# Patient Record
Sex: Female | Born: 1955 | Race: White | Hispanic: No | Marital: Single | Smoking: Never smoker
Health system: Southern US, Community
[De-identification: ages and names within clinical notes are randomized; demographics above are authoritative.]

## PROBLEM LIST (undated history)

## (undated) DIAGNOSIS — F79 Unspecified intellectual disabilities: Secondary | ICD-10-CM

## (undated) DIAGNOSIS — M629 Disorder of muscle, unspecified: Secondary | ICD-10-CM

## (undated) DIAGNOSIS — G809 Cerebral palsy, unspecified: Secondary | ICD-10-CM

## (undated) DIAGNOSIS — G839 Paralytic syndrome, unspecified: Secondary | ICD-10-CM

## (undated) DIAGNOSIS — R569 Unspecified convulsions: Secondary | ICD-10-CM

## (undated) DIAGNOSIS — G0490 Encephalitis and encephalomyelitis, unspecified: Secondary | ICD-10-CM

---

## 2003-06-04 ENCOUNTER — Encounter: Payer: Self-pay | Admitting: Family Medicine

## 2003-06-04 ENCOUNTER — Encounter: Admission: RE | Admit: 2003-06-04 | Discharge: 2003-06-04 | Payer: Self-pay | Admitting: Family Medicine

## 2004-06-04 ENCOUNTER — Encounter: Admission: RE | Admit: 2004-06-04 | Discharge: 2004-06-04 | Payer: Self-pay | Admitting: Family Medicine

## 2004-10-06 ENCOUNTER — Emergency Department (HOSPITAL_COMMUNITY): Admission: EM | Admit: 2004-10-06 | Discharge: 2004-10-06 | Payer: Self-pay | Admitting: Emergency Medicine

## 2004-11-11 ENCOUNTER — Ambulatory Visit (HOSPITAL_COMMUNITY): Admission: RE | Admit: 2004-11-11 | Discharge: 2004-11-11 | Payer: Self-pay | Admitting: Gastroenterology

## 2005-05-10 ENCOUNTER — Encounter: Admission: RE | Admit: 2005-05-10 | Discharge: 2005-05-10 | Payer: Self-pay | Admitting: Family Medicine

## 2005-06-07 ENCOUNTER — Encounter: Admission: RE | Admit: 2005-06-07 | Discharge: 2005-06-07 | Payer: Self-pay | Admitting: Family Medicine

## 2006-06-09 ENCOUNTER — Encounter: Admission: RE | Admit: 2006-06-09 | Discharge: 2006-06-09 | Payer: Self-pay | Admitting: Family Medicine

## 2006-06-30 ENCOUNTER — Encounter: Admission: RE | Admit: 2006-06-30 | Discharge: 2006-06-30 | Payer: Self-pay | Admitting: Family Medicine

## 2007-01-12 ENCOUNTER — Ambulatory Visit (HOSPITAL_COMMUNITY): Admission: RE | Admit: 2007-01-12 | Discharge: 2007-01-12 | Payer: Self-pay | Admitting: Family Medicine

## 2007-05-29 ENCOUNTER — Emergency Department (HOSPITAL_COMMUNITY): Admission: EM | Admit: 2007-05-29 | Discharge: 2007-05-29 | Payer: Self-pay | Admitting: Family Medicine

## 2007-07-04 ENCOUNTER — Encounter: Admission: RE | Admit: 2007-07-04 | Discharge: 2007-07-04 | Payer: Self-pay | Admitting: Family Medicine

## 2007-10-09 ENCOUNTER — Encounter: Admission: RE | Admit: 2007-10-09 | Discharge: 2007-10-09 | Payer: Self-pay | Admitting: Family Medicine

## 2008-07-09 ENCOUNTER — Encounter: Admission: RE | Admit: 2008-07-09 | Discharge: 2008-07-09 | Payer: Self-pay | Admitting: Family Medicine

## 2008-10-22 ENCOUNTER — Encounter: Admission: RE | Admit: 2008-10-22 | Discharge: 2008-10-22 | Payer: Self-pay | Admitting: Family Medicine

## 2008-10-22 ENCOUNTER — Inpatient Hospital Stay (HOSPITAL_COMMUNITY): Admission: EM | Admit: 2008-10-22 | Discharge: 2008-10-24 | Payer: Self-pay | Admitting: Emergency Medicine

## 2009-07-19 ENCOUNTER — Emergency Department (HOSPITAL_COMMUNITY): Admission: EM | Admit: 2009-07-19 | Discharge: 2009-07-19 | Payer: Self-pay | Admitting: Emergency Medicine

## 2010-09-05 ENCOUNTER — Encounter: Payer: Self-pay | Admitting: Family Medicine

## 2010-11-26 LAB — DIFFERENTIAL
Eosinophils Relative: 3 % (ref 0–5)
Lymphocytes Relative: 39 % (ref 12–46)
Lymphs Abs: 1.9 10*3/uL (ref 0.7–4.0)

## 2010-11-26 LAB — CARDIAC PANEL(CRET KIN+CKTOT+MB+TROPI)
CK, MB: 1.5 ng/mL (ref 0.3–4.0)
CK, MB: 3.4 ng/mL (ref 0.3–4.0)
Total CK: 77 U/L (ref 7–177)
Troponin I: <0.01 ng/mL (ref 0.00–0.06)

## 2010-11-26 LAB — BASIC METABOLIC PANEL
BUN: 14 mg/dL (ref 6–23)
BUN: 25 mg/dL — ABNORMAL HIGH (ref 6–23)
BUN: 28 mg/dL — ABNORMAL HIGH (ref 6–23)
BUN: 6 mg/dL (ref 6–23)
CO2: 25 mEq/L (ref 19–32)
CO2: 27 mEq/L (ref 19–32)
CO2: 27 mEq/L (ref 19–32)
Chloride: 101 mEq/L (ref 96–112)
Chloride: 115 mEq/L — ABNORMAL HIGH (ref 96–112)
Chloride: 122 mEq/L — ABNORMAL HIGH (ref 96–112)
Chloride: 128 mEq/L — ABNORMAL HIGH (ref 96–112)
Chloride: 129 mEq/L — ABNORMAL HIGH (ref 96–112)
Creatinine, Ser: 0.66 mg/dL (ref 0.4–1.2)
Creatinine, Ser: 0.67 mg/dL (ref 0.4–1.2)
GFR calc Af Amer: >60 mL/min (ref >60)
GFR calc Af Amer: >60 mL/min (ref >60)
GFR calc non Af Amer: >60 mL/min (ref >60)
Glucose, Bld: 110 mg/dL — ABNORMAL HIGH (ref 70–99)
Glucose, Bld: 216 mg/dL — ABNORMAL HIGH (ref 70–99)
Potassium: 2.9 mEq/L — ABNORMAL LOW (ref 3.5–5.1)
Potassium: 3 mEq/L — ABNORMAL LOW (ref 3.5–5.1)
Potassium: 3.4 mEq/L — ABNORMAL LOW (ref 3.5–5.1)
Potassium: 3.9 mEq/L (ref 3.5–5.1)
Sodium: 145 mEq/L (ref 135–145)
Sodium: 150 mEq/L — ABNORMAL HIGH (ref 135–145)
Sodium: 157 mEq/L — ABNORMAL HIGH (ref 135–145)

## 2010-11-26 LAB — CBC
HCT: 39.6 % (ref 36.0–46.0)
Platelets: 119 10*3/uL — ABNORMAL LOW (ref 150–400)
WBC: 4.8 10*3/uL (ref 4.0–10.5)

## 2010-11-26 LAB — URINE MICROSCOPIC-ADD ON

## 2010-11-26 LAB — URINALYSIS, ROUTINE W REFLEX MICROSCOPIC
Nitrite: NEGATIVE
Specific Gravity, Urine: 1.038 — ABNORMAL HIGH (ref 1.005–1.030)
Urobilinogen, UA: 1 mg/dL (ref 0.0–1.0)

## 2010-11-26 LAB — POCT I-STAT, CHEM 8
BUN: 40 mg/dL — ABNORMAL HIGH (ref 6–23)
Chloride: 126 mEq/L — ABNORMAL HIGH (ref 96–112)
Creatinine, Ser: 0.8 mg/dL (ref 0.4–1.2)
Potassium: 2.9 mEq/L — ABNORMAL LOW (ref 3.5–5.1)
Sodium: 163 mEq/L (ref 135–145)

## 2010-11-26 LAB — PHOSPHORUS
Phosphorus: 2.3 mg/dL (ref 2.3–4.6)
Phosphorus: 3.2 mg/dL (ref 2.3–4.6)

## 2010-11-26 LAB — HEMOGLOBIN A1C
Hgb A1c MFr Bld: 5.6 % (ref 4.6–6.1)
Mean Plasma Glucose: 114 mg/dL

## 2010-11-26 LAB — MAGNESIUM
Magnesium: 1.7 mg/dL (ref 1.5–2.5)
Magnesium: 2.1 mg/dL (ref 1.5–2.5)

## 2010-11-26 LAB — CK TOTAL AND CKMB (NOT AT ARMC)
CK, MB: 1.4 ng/mL (ref 0.3–4.0)
Total CK: 70 U/L (ref 7–177)

## 2010-11-26 LAB — URINE CULTURE

## 2010-11-26 LAB — HEPATIC FUNCTION PANEL
AST: 23 U/L (ref 0–37)
Albumin: 3.2 g/dL — ABNORMAL LOW (ref 3.5–5.2)

## 2010-11-26 LAB — HOMOCYSTEINE: Homocysteine: 7.9 umol/L (ref 4.0–15.4)

## 2010-11-26 LAB — TROPONIN I: Troponin I: <0.01 ng/mL (ref 0.00–0.06)

## 2010-12-29 NOTE — Discharge Summary (Signed)
[name], CURL                ACCOUNT NO.:  [id number]   MEDICAL RECORD NO.:  [id number]          PATIENT TYPE:  INP   LOCATION:  1232                         FACILITY:  [facility]   PHYSICIAN:  Herbie Saxon, MDDATE OF BIRTH:  02-15-1956   DATE OF ADMISSION:  10/22/2008  DATE OF DISCHARGE:  10/24/2008                               DISCHARGE SUMMARY   DISCHARGE DIAGNOSES:  1. Severe dehydration.  2. Hyponatremia, resolved.  3. Hypokalemia, repleted.  4. History of seizure.  5. History of mild spastic quadriparesis.  6. History of mental retardation.  7. History of cerebrovascular accident.  8. Thrombocytopenia.   CLINICAL DATA:  Radiology:  Chest x-ray of September 24, 2008 showed no  acute cardiopulmonary disease.   HOSPITAL COURSE:  This 55 year old Caucasian lady, group home resident,  presented to the emergency room with severe dehydration.  As per the  records, the patient had been having poor oral intake for the previous  few days prior to this presentation.  On arrival, her sodium level was  163, potassium level was 2.9, BUN 40 and creatinine was 0.8.  The  patient was admitted and started on IV fluid hydration with D5 water  with potassium being supplemented parenterally and her BMP was being  checked every other 4 hours.  Electrolyte abnormalities have been  corrected and the patient is physically back to her baseline.  No new  complaints.   CONDITION ON DISCHARGE:  Stable.   DIET:  Diet will be pureed with thickened liquids.   DISPOSITION:  Back to the group home.   FOLLOWUP:  Followup with Dr. Delene Ruffini of the group home in the  next 5 to 7 days.   DISCHARGE MEDICATIONS:  1. Chlorpheniramine maleate 4 mg q.8h. p.r.n.  2. Fluticasone furoate 1 spray daily nasally.  3. Valium 2 mg t.i.d.  4. Milk of Magnesia 30 mL q.6h. p.r.n.  5. Ativan 1 mg q.4-6h. p.r.n.  6. Multivitamin 5 mL daily.   PHYSICAL EXAMINATION:  GENERAL:  Upon examination, she is a  middle-aged  lady not in acute respiratory distress.  She is mentally retarded, non-  verbalizing congruently.  VITAL SIGNS:  Temperature 98, pulse 82, respiratory rate 18, blood  pressure is 110/72.  HEENT: Pupils equal and reacting to light and accommodation.  She does  not appear jaundiced.  NECK:  Supple.  EXTREMITIES:  She has atrophic extremities with deformities.  Peripheral  pulses are present.  No pedal edema.  CHEST:  Her chest is clinically clear.  HEART:  Sounds 1 and 2 regular rate and rhythm.  ABDOMEN:  Soft, not tender. Bowel sounds present.  No tenderness.   LABORATORY DATA:  WBC is 4.8, hematocrit is 39.6, platelet count is  119,000.  Chemistry:  Sodium is 144, potassium 4.0, chloride 115,  bicarbonate 24, glucose 110.  BUN 6, creatinine 0.6.   Discharge time greater than 30 minutes.      Herbie Saxon, MD  Electronically Signed     MIO/MEDQ  D:  10/24/2008  T:  10/24/2008  Job:  [id number]   cc:  Group Home, Dr. Delene Ruffini

## 2010-12-29 NOTE — Procedures (Signed)
PROCEDURE:  Electroencephalogram.   OPERATOR:  Pramod P. Pearlean Brownie, M.D.   CLINICAL HISTORY:  This 55 year old lady with severe cerebral palsy and  mental retardation and  __________  seizures.  The technician notes  excessive muscle movement and body twitches during the recording.   MEDICATIONS:  1. Chloral hydrate.  2. Ativan.  3. Flonase.  4. Senokot.  5. Chlor-Trimeton.   DESCRIPTION OF PROCEDURE:  This is a sleep-deprived electroencephalogram  recorded with the patient being fully awake and combative during this  study, using the standard 10/20 electrode placement and a 17-channel  machine.   The background awake rhythm is probably 7-8 [employer] which is admixed  with excessive muscle movement artifact such as high frequency, low  amplitude beta activity with medication effect.  Excessive muscle  artifacts noted throughout the tracing.  No paroxysmal epileptiform  activity, spikes or sharp waves are seen.  Definitive sleep stages are  not seen in this recording.  The length of this electroencephalogram is  25.1 minutes.  Technical component is suboptimal in excessive muscle  movement artifacts.   Electrocardiogram tracing was a regular sinus rhythm.   Definitive sleep tracing is not seen.  Photic stimulation is  unremarkable.  Hyperventilation is not performed.   IMPRESSION:  This electroencephalogram performed during the awake state  is suboptimal due to excessive muscle movement artifact.  There is mild  generalized slowing, indicative of mild bi-hemispheric dysfunction.  No  definitive epileptiform activity is identified.           ______________________________  Sunny Schlein. Pearlean Brownie, MD     ZOX:WRUE  D:  01/12/2007 14:57:59  T:  01/12/2007 15:34:39  Job #:  [id number]

## 2010-12-29 NOTE — H&P (Signed)
[name], [name]                ACCOUNT NO.:  [id number]   MEDICAL RECORD NO.:  [id number]          PATIENT TYPE:  INP   LOCATION:  1232                         FACILITY:  [facility]   PHYSICIAN:  Vania Rea, M.D. DATE OF BIRTH:  1956-02-27   DATE OF ADMISSION:  10/22/2008  DATE OF DISCHARGE:                              HISTORY & PHYSICAL   PRIMARY CARE PHYSICIAN:  Dr. Carlis Abbott at [facility] group home.   CHIEF COMPLAINT:  Hypernatremia.   HISTORY OF PRESENT ILLNESS:  This is a profoundly mentally retarded 55-  year-old Caucasian lady, resident of [facility] group home who is bed and  wheelchair confined, usually has a pureed diet with thickened liquids,  and apparently for the past few days has not been eating well.  Blood  work was ordered today and she was found to be severely hypernatremic  and she was brought to the emergency room for evaluation and treatment.  The patient does not communicate meaningfully with her environment as  far as I can tell, and therefore, history can only be obtained from her  accompanying attendant and medical records which accompany her.   PAST MEDICAL HISTORY:  1. Profound mental retardation with her mental age of about 8 months.      She is nonverbal, noncommunicative.  2. History of seizures.  3. History of mild spastic quadriparesis.  4. Left hemiplegia.  5. History of thoracolumbar scoliosis.  6. History of mild athetosis.   MEDICATIONS:  1. Chlorpheniramine 4 mg p.r.n.  2. Fluticasone nasal spray 50 mcg daily.  3. Lorazepam 1 mg every 4 hours p.r.n. which she has not been      receiving much apparently.  4. Milk of magnesia 30 mg daily.  5. Valium 2 mg three times daily for frequent twitching.   ALLERGIES:  Pseudoephedrine and sympathomimetic agents.   SOCIAL HISTORY:  Other than noted above, unable to obtain further.   FAMILY HISTORY:  Unable to obtain.   REVIEW OF SYSTEMS:  Other than noted above, unable to obtain.   PHYSICAL  EXAMINATION:  GENERAL:  Small built Caucasian lady lying in the  stretcher in a strange deformed position.  VITAL SIGNS:  Temperature is 98.3, pulse 97, respirations 18, blood  pressure 160/66.  She is saturating at 95% on room air.  She does not  appear to be in any pain.  HEENT:  Her pupils are round, mucous membranes pink and dry.  Her tongue  is protruding from her mouth.  There is no cervical lymphadenopathy or  lymphadenopathy.  CHEST:  Clear to auscultation bilaterally.  CARDIOVASCULAR:  Regular rhythm without murmur.  ABDOMEN:  Soft and nontender.  EXTREMITIES:  Without edema.  CENTRAL NERVOUS SYSTEM:  She has generalized floppiness.   LABORATORY DATA:  Her CBC is reviewed and is unremarkable.  Serum  chemistry is reviewed and is remarkable for a sodium of 163, potassium  of 2.9, chloride of 126, BUN of 40, a creatinine of 0.8, glucose is 130.  Urinalysis rare epithelial cells, 0-2 white cells, few bacteria, cloudy  in appearance with a specific gravity  of 1.038, 30 protein, negative for  nitrites or leukocyte esterase.   ASSESSMENT:  1. Dehydration as evidenced by elevated HQI:ONGEXBMWUX ratio and      hypernatremia.  2. Hypokalemia.  3. Profound mental retardation and chronic neurological abnormalities      as noted above.   PLAN:  Presumably this lady's metabolic derangement is due to lack of  oral intake since the attendant insists there is no history of diarrhea,  that her stool is pasty.  Will hydrate with free water and replace her  potassium.  Will keep her n.p.o. initially, and when she is more stable,  will resume oral pureed diet and ensure that she can maintain  electrolyte balance with oral intake.  Other plans as per orders.   CODE STATUS:  Records indicate the patient is a full code.      Vania Rea, M.D.  Electronically Signed     LC/MEDQ  D:  10/22/2008  T:  10/23/2008  Job:  [id number]   cc:   [facility] Dr. Carlis Abbott

## 2011-01-01 ENCOUNTER — Other Ambulatory Visit: Payer: Self-pay | Admitting: Family Medicine

## 2011-01-01 ENCOUNTER — Ambulatory Visit
Admission: RE | Admit: 2011-01-01 | Discharge: 2011-01-01 | Disposition: A | Discharge status: Home / Self Care | Payer: Medicaid Other | Source: Ambulatory Visit | Attending: Family Medicine | Admitting: Family Medicine

## 2011-01-01 DIAGNOSIS — IMO0001 Reserved for inherently not codable concepts without codable children: Secondary | ICD-10-CM

## 2011-01-01 DIAGNOSIS — T148XXA Other injury of unspecified body region, initial encounter: Secondary | ICD-10-CM

## 2011-12-13 ENCOUNTER — Encounter (HOSPITAL_COMMUNITY): Payer: Self-pay | Admitting: Emergency Medicine

## 2011-12-13 ENCOUNTER — Observation Stay (HOSPITAL_COMMUNITY)
Admission: EM | Admit: 2011-12-13 | Discharge: 2011-12-15 | Disposition: A | Discharge status: Home / Self Care | Payer: Medicaid Other | Attending: Family Medicine | Admitting: Family Medicine

## 2011-12-13 ENCOUNTER — Emergency Department (HOSPITAL_COMMUNITY): Payer: Medicaid Other

## 2011-12-13 DIAGNOSIS — N289 Disorder of kidney and ureter, unspecified: Secondary | ICD-10-CM | POA: Insufficient documentation

## 2011-12-13 DIAGNOSIS — Z23 Encounter for immunization: Secondary | ICD-10-CM | POA: Insufficient documentation

## 2011-12-13 DIAGNOSIS — R4182 Altered mental status, unspecified: Secondary | ICD-10-CM | POA: Insufficient documentation

## 2011-12-13 DIAGNOSIS — R259 Unspecified abnormal involuntary movements: Secondary | ICD-10-CM | POA: Insufficient documentation

## 2011-12-13 DIAGNOSIS — G808 Other cerebral palsy: Secondary | ICD-10-CM | POA: Insufficient documentation

## 2011-12-13 DIAGNOSIS — F72 Severe intellectual disabilities: Secondary | ICD-10-CM | POA: Insufficient documentation

## 2011-12-13 DIAGNOSIS — J189 Pneumonia, unspecified organism: Principal | ICD-10-CM | POA: Diagnosis present

## 2011-12-13 DIAGNOSIS — R1310 Dysphagia, unspecified: Secondary | ICD-10-CM | POA: Insufficient documentation

## 2011-12-13 HISTORY — DX: Paralytic syndrome, unspecified: G83.9

## 2011-12-13 HISTORY — DX: Disorder of muscle, unspecified: M62.9

## 2011-12-13 HISTORY — DX: Unspecified intellectual disabilities: F79

## 2011-12-13 HISTORY — DX: Encephalitis and encephalomyelitis, unspecified: G04.90

## 2011-12-13 HISTORY — DX: Cerebral palsy, unspecified: G80.9

## 2011-12-13 HISTORY — DX: Unspecified convulsions: R56.9

## 2011-12-13 LAB — DIFFERENTIAL
Basophils Absolute: 0 10*3/uL (ref 0.0–0.1)
Basophils Relative: 0 % (ref 0–1)
Eosinophils Absolute: 0.1 10*3/uL (ref 0.0–0.7)
Eosinophils Relative: 1 % (ref 0–5)
Monocytes Absolute: 0.6 10*3/uL (ref 0.1–1.0)
Monocytes Relative: 8 % (ref 3–12)
Neutro Abs: 4.2 10*3/uL (ref 1.7–7.7)

## 2011-12-13 LAB — CBC
HCT: 34 % — ABNORMAL LOW (ref 36.0–46.0)
MCH: 32.9 pg (ref 26.0–34.0)
MCHC: 34.7 g/dL (ref 30.0–36.0)
MCV: 94.7 fL (ref 78.0–100.0)
RDW: 12.3 % (ref 11.5–15.5)

## 2011-12-13 LAB — BASIC METABOLIC PANEL
BUN: 24 mg/dL — ABNORMAL HIGH (ref 6–23)
Calcium: 8.7 mg/dL (ref 8.4–10.5)
Creatinine, Ser: 0.52 mg/dL (ref 0.50–1.10)
GFR calc Af Amer: >90 mL/min (ref >90)
GFR calc non Af Amer: >90 mL/min (ref >90)

## 2011-12-13 LAB — POCT I-STAT 3, VENOUS BLOOD GAS (G3P V)
O2 Saturation: 87 %
pCO2, Ven: 39.2 mmHg — ABNORMAL LOW (ref 45.0–50.0)
pO2, Ven: 54 mmHg — ABNORMAL HIGH (ref 30.0–45.0)

## 2011-12-13 LAB — URINALYSIS, ROUTINE W REFLEX MICROSCOPIC
Bilirubin Urine: NEGATIVE
Glucose, UA: NEGATIVE mg/dL
Ketones, ur: 15 mg/dL — AB
Leukocytes, UA: NEGATIVE
Protein, ur: NEGATIVE mg/dL

## 2011-12-13 LAB — PRO B NATRIURETIC PEPTIDE: Pro B Natriuretic peptide (BNP): 63.2 pg/mL (ref 0–125)

## 2011-12-13 LAB — WET PREP, GENITAL: Yeast Wet Prep HPF POC: NONE SEEN

## 2011-12-13 MED ORDER — LORAZEPAM 2 MG/ML IJ SOLN
INTRAMUSCULAR | Status: AC
  Administered 2011-12-13: 1 mg via INTRAVENOUS
  Filled 2011-12-13: qty 1

## 2011-12-13 MED ORDER — LORAZEPAM 2 MG/ML IJ SOLN
1.0000 mg | Freq: Once | INTRAMUSCULAR | Status: AC
Start: 2011-12-13 — End: 2011-12-13
  Administered 2011-12-13: 1 mg via INTRAVENOUS

## 2011-12-13 NOTE — ED Notes (Signed)
PT from [facility]; dx with mental retardation, quadreplegia, staff reporting shaking, chills and sweating. Temperature 99.8 at 6:20 pm today.

## 2011-12-13 NOTE — ED Provider Notes (Addendum)
History     CSN: [id number]  Arrival date & time 12/13/11  1191   First MD Initiated Contact with Patient 12/13/11 2126      Chief Complaint  Patient presents with  . Altered Mental Status    (Consider location/radiation/quality/duration/timing/severity/associated sxs/prior treatment) HPI Level V caveat severe mental retardation History is from [place] Gray, nurse at intermediate care facility your patient lives Patient has had increasing tremors since today with maximum temperature 99.8 degrees no cough she has been treated with her usual dose of Ativan, without relief, also with sweating today. Also with cough throughout the day Past Medical History  Diagnosis Date  . Seizures   . Mental retardation   . Encephalitis     No past surgical history on file.  No family history on file.  History  Substance Use Topics  . Smoking status: Not on file  . Smokeless tobacco: Not on file  . Alcohol Use:    nonsmoker no alcohol no drugs lives in intermediate care facility  OB History    Grav Para Term Preterm Abortions TAB SAB Ect Mult Living                  Review of Systems  Unable to perform ROS: Dementia  Respiratory: Positive for cough.     Allergies  Pseudoephedrine and Sympathomimetics  Home Medications   Current Outpatient Rx  Name Route Sig Dispense Refill  . CASCARA SAGRADA PO Oral Take 1,500 mg by mouth daily.    . CHLOR-TRIMETON PO Oral Take 1 tablet by mouth every 8 (eight) hours as needed. For allergies    . LORAZEPAM 1 MG PO TABS Oral Take 1 mg by mouth 3 (three) times daily.    Marland Kitchen NITROFURANTOIN MACROCRYSTAL 100 MG PO CAPS Oral Take 100 mg by mouth at bedtime.    . MULTI-DELYN PO LIQD Oral Take 5 mLs by mouth daily.    . DISPOSABLE ENEMA 19-7 GM/118ML RE ENEM Rectal Place 1 enema rectally once as needed. For constipation    . TRAMADOL HCL 50 MG PO TABS Oral Take 25 mg by mouth 4 (four) times daily as needed. For pain      BP 121/97  Pulse 91   Temp(Src) 98.8 F (37.1 C) (Axillary)  Resp 22  SpO2 97%  Physical Exam  Nursing note and vitals reviewed. Constitutional:       Chronically ill-appearing  HENT:  Right Ear: External ear normal.  Left Ear: External ear normal.       Mucous membranes dry  Neck: Neck supple.  Pulmonary/Chest: Effort normal and breath sounds normal.  Abdominal: Soft. Bowel sounds are normal.  Musculoskeletal: She exhibits no tenderness.  Neurological: She is alert.       All 4 extremities with muscular atrophy  Skin: Skin is warm and dry.   neurologic exam occasional tremors and myoclonic jerks  ED Course  Procedures (including critical care time)  Labs Reviewed - No data to display Dg Chest 2 View  12/13/2011  *RADIOLOGY REPORT*  Clinical Data: Fever  CHEST - 2 VIEW  Comparison: 10/22/2008  Findings: Degraded by patient rotation and hypoaeration. Perihilar/retrocardiac opacity, mild.  No pneumothorax.  No pleural effusion.  Curvature of the thoracolumbar spine.  Diffuse osteopenia.  Limited evaluation for fracture.  IMPRESSION: Mild perihilar/retrocardiac opacity; edema versus infiltrate.  Original Report Authenticated By: Waneta Martins, M.D.    Results for orders placed during the hospital encounter of 12/13/11  URINALYSIS, ROUTINE W REFLEX  MICROSCOPIC      Component Value Range   Color, Urine AMBER (*) YELLOW    APPearance CLOUDY (*) CLEAR    Specific Gravity, Urine 1.031 (*) 1.005 - 1.030    pH 5.5  5.0 - 8.0    Glucose, UA NEGATIVE  NEGATIVE (mg/dL)   Hgb urine dipstick NEGATIVE  NEGATIVE    Bilirubin Urine NEGATIVE  NEGATIVE    Ketones, ur 15 (*) NEGATIVE (mg/dL)   Protein, ur NEGATIVE  NEGATIVE (mg/dL)   Urobilinogen, UA 0.2  0.0 - 1.0 (mg/dL)   Nitrite NEGATIVE  NEGATIVE    Leukocytes, UA NEGATIVE  NEGATIVE   CBC      Component Value Range   WBC 7.4  4.0 - 10.5 (K/uL)   RBC 3.59 (*) 3.87 - 5.11 (MIL/uL)   Hemoglobin 11.8 (*) 12.0 - 15.0 (g/dL)   HCT 16.1 (*) 09.6 - 46.0 (%)    MCV 94.7  78.0 - 100.0 (fL)   MCH 32.9  26.0 - 34.0 (pg)   MCHC 34.7  30.0 - 36.0 (g/dL)   RDW 04.5  40.9 - 81.1 (%)   Platelets 144 (*) 150 - 400 (K/uL)  DIFFERENTIAL      Component Value Range   Neutrophils Relative 57  43 - 77 (%)   Neutro Abs 4.2  1.7 - 7.7 (K/uL)   Lymphocytes Relative 34  12 - 46 (%)   Lymphs Abs 2.5  0.7 - 4.0 (K/uL)   Monocytes Relative 8  3 - 12 (%)   Monocytes Absolute 0.6  0.1 - 1.0 (K/uL)   Eosinophils Relative 1  0 - 5 (%)   Eosinophils Absolute 0.1  0.0 - 0.7 (K/uL)   Basophils Relative 0  0 - 1 (%)   Basophils Absolute 0.0  0.0 - 0.1 (K/uL)  BASIC METABOLIC PANEL      Component Value Range   Sodium 140  135 - 145 (mEq/L)   Potassium 3.7  3.5 - 5.1 (mEq/L)   Chloride 107  96 - 112 (mEq/L)   CO2 22  19 - 32 (mEq/L)   Glucose, Bld 104 (*) 70 - 99 (mg/dL)   BUN 24 (*) 6 - 23 (mg/dL)   Creatinine, Ser 9.14  0.50 - 1.10 (mg/dL)   Calcium 8.7  8.4 - 78.2 (mg/dL)   GFR calc non Af Amer >90  >90 (mL/min)   GFR calc Af Amer >90  >90 (mL/min)  PRO B NATRIURETIC PEPTIDE      Component Value Range   Pro B Natriuretic peptide (BNP) 63.2  0 - 125 (pg/mL)  WET PREP, GENITAL      Component Value Range   Yeast Wet Prep HPF POC NONE SEEN  NONE SEEN    Trich, Wet Prep NONE SEEN  NONE SEEN    Clue Cells Wet Prep HPF POC NONE SEEN  NONE SEEN    WBC, Wet Prep HPF POC FEW (*) NONE SEEN   POCT I-STAT 3, BLOOD GAS (G3P V)      Component Value Range   pH, Ven 7.385 (*) 7.250 - 7.300    pCO2, Ven 39.2 (*) 45.0 - 50.0 (mmHg)   pO2, Ven 54.0 (*) 30.0 - 45.0 (mmHg)   Bicarbonate 23.5  20.0 - 24.0 (mEq/L)   TCO2 25  0 - 100 (mmol/L)   O2 Saturation 87.0     Acid-base deficit 1.0  0.0 - 2.0 (mmol/L)   Sample type VENOUS     Dg Chest 2  View  12/13/2011  *RADIOLOGY REPORT*  Clinical Data: Fever  CHEST - 2 VIEW  Comparison: 10/22/2008  Findings: Degraded by patient rotation and hypoaeration. Perihilar/retrocardiac opacity, mild.  No pneumothorax.  No pleural effusion.   Curvature of the thoracolumbar spine.  Diffuse osteopenia.  Limited evaluation for fracture.  IMPRESSION: Mild perihilar/retrocardiac opacity; edema versus infiltrate.  Original Report Authenticated By: Waneta Martins, M.D.    No diagnosis found.  12:15 PM patient less tremulous after treatment with intravenous Ativan MDM  In light of chest x-ray findings and low BNP will treat for pneumonia Cervical cultures obtained by nurse as she noted vaginal discharge upon inserting catheter to obtain urinalysis Spoke with Dr.Bonno Plan 23 hour observation med surge floor Diagnosis #1community-acquired pneumonia #2 tremors         Doug Sou, MD 12/14/11 0031  Doug Sou, MD 12/14/11 0981

## 2011-12-14 ENCOUNTER — Encounter (HOSPITAL_COMMUNITY): Payer: Self-pay | Admitting: Internal Medicine

## 2011-12-14 DIAGNOSIS — J189 Pneumonia, unspecified organism: Secondary | ICD-10-CM | POA: Diagnosis present

## 2011-12-14 LAB — CREATININE, SERUM
Creatinine, Ser: 0.48 mg/dL — ABNORMAL LOW (ref 0.50–1.10)
GFR calc non Af Amer: >90 mL/min (ref >90)

## 2011-12-14 LAB — CBC
MCHC: 33.9 g/dL (ref 30.0–36.0)
Platelets: 143 10*3/uL — ABNORMAL LOW (ref 150–400)
RDW: 12.4 % (ref 11.5–15.5)

## 2011-12-14 LAB — STREP PNEUMONIAE URINARY ANTIGEN: Strep Pneumo Urinary Antigen: NEGATIVE

## 2011-12-14 LAB — LEGIONELLA ANTIGEN, URINE

## 2011-12-14 LAB — MRSA PCR SCREENING: MRSA by PCR: NEGATIVE

## 2011-12-14 MED ORDER — SODIUM CHLORIDE 0.9 % IJ SOLN: 3.0000 mL | INTRAMUSCULAR | Status: DC | PRN

## 2011-12-14 MED ORDER — STARCH (THICKENING) PO POWD
ORAL | Status: DC | PRN
  Filled 2011-12-14: qty 227

## 2011-12-14 MED ORDER — ENSURE COMPLETE PO LIQD
237.0000 mL | Freq: Every day | ORAL | Status: DC
  Administered 2011-12-14: 237 mL via ORAL

## 2011-12-14 MED ORDER — DEXTROSE 5 % IV SOLN
1.0000 g | Freq: Once | INTRAVENOUS | Status: AC
  Administered 2011-12-14: 1 g via INTRAVENOUS
  Filled 2011-12-14: qty 10

## 2011-12-14 MED ORDER — DEXTROSE 5 % IV SOLN
500.0000 mg | Freq: Once | INTRAVENOUS | Status: DC
  Filled 2011-12-14 (×2): qty 500

## 2011-12-14 MED ORDER — ONDANSETRON HCL 4 MG/2ML IJ SOLN: 4.0000 mg | Freq: Four times a day (QID) | INTRAMUSCULAR | Status: DC | PRN

## 2011-12-14 MED ORDER — ACETAMINOPHEN 325 MG PO TABS: 650.0000 mg | ORAL_TABLET | Freq: Four times a day (QID) | ORAL | Status: DC | PRN

## 2011-12-14 MED ORDER — ONDANSETRON HCL 4 MG PO TABS: 4.0000 mg | ORAL_TABLET | Freq: Four times a day (QID) | ORAL | Status: DC | PRN

## 2011-12-14 MED ORDER — ACETAMINOPHEN 650 MG RE SUPP: 650.0000 mg | Freq: Four times a day (QID) | RECTAL | Status: DC | PRN

## 2011-12-14 MED ORDER — PNEUMOCOCCAL VAC POLYVALENT 25 MCG/0.5ML IJ INJ
0.5000 mL | INJECTION | INTRAMUSCULAR | Status: AC
  Administered 2011-12-15: 0.5 mL via INTRAMUSCULAR
  Filled 2011-12-14: qty 0.5

## 2011-12-14 MED ORDER — SODIUM CHLORIDE 0.9 % IV SOLN
Freq: Once | INTRAVENOUS | Status: AC
  Administered 2011-12-14: 12:00:00 via INTRAVENOUS

## 2011-12-14 MED ORDER — SODIUM CHLORIDE 0.9 % IV SOLN: 250.0000 mL | INTRAVENOUS | Status: DC | PRN

## 2011-12-14 MED ORDER — DEXTROSE 5 % IV SOLN
1.0000 g | INTRAVENOUS | Status: DC
  Administered 2011-12-14: 1 g via INTRAVENOUS
  Filled 2011-12-14 (×2): qty 10

## 2011-12-14 MED ORDER — ENOXAPARIN SODIUM 40 MG/0.4ML ~~LOC~~ SOLN
40.0000 mg | Freq: Every day | SUBCUTANEOUS | Status: DC
  Administered 2011-12-14 – 2011-12-15 (×2): 40 mg via SUBCUTANEOUS
  Filled 2011-12-14 (×2): qty 0.4

## 2011-12-14 MED ORDER — DEXTROSE 5 % IV SOLN
250.0000 mg | INTRAVENOUS | Status: DC
  Administered 2011-12-15: 250 mg via INTRAVENOUS
  Filled 2011-12-14: qty 250

## 2011-12-14 MED ORDER — SENNA 8.6 MG PO TABS
1.0000 | ORAL_TABLET | Freq: Two times a day (BID) | ORAL | Status: DC
  Administered 2011-12-14 – 2011-12-15 (×2): 8.6 mg via ORAL
  Filled 2011-12-14 (×4): qty 1

## 2011-12-14 MED ORDER — ENOXAPARIN SODIUM 40 MG/0.4ML ~~LOC~~ SOLN
40.0000 mg | SUBCUTANEOUS | Status: DC
  Filled 2011-12-14: qty 0.4

## 2011-12-14 MED ORDER — SODIUM CHLORIDE 0.9 % IJ SOLN: 3.0000 mL | Freq: Two times a day (BID) | INTRAMUSCULAR | Status: DC

## 2011-12-14 MED ORDER — DOCUSATE SODIUM 100 MG PO CAPS
100.0000 mg | ORAL_CAPSULE | Freq: Two times a day (BID) | ORAL | Status: DC
  Administered 2011-12-14 – 2011-12-15 (×3): 100 mg via ORAL
  Filled 2011-12-14 (×4): qty 1

## 2011-12-14 NOTE — Progress Notes (Addendum)
INITIAL ADULT NUTRITION ASSESSMENT Date: 12/14/2011   Time: 12:38 PM Reason for Assessment: nutrition risk  ASSESSMENT: Female 56 y.o.  Dx: Community acquired pneumonia  Hx:  Past Medical History  Diagnosis Date  . Seizures   . Mental retardation   . Encephalitis   . Cerebral palsy   . Spastic Paresis    No past surgical history on file.  Related Meds:  Scheduled Meds:   . sodium chloride   Intravenous Once  . azithromycin  250 mg Intravenous Q24H  . azithromycin  500 mg Intravenous Once  . cefTRIAXone (ROCEPHIN)  IV  1 g Intravenous Once  . cefTRIAXone (ROCEPHIN)  IV  1 g Intravenous Q24H  . docusate sodium  100 mg Oral BID  . enoxaparin  40 mg Subcutaneous Daily  . LORazepam  1 mg Intravenous Once  . senna  1 tablet Oral BID  . DISCONTD: enoxaparin  40 mg Subcutaneous Q24H  . DISCONTD: sodium chloride  3 mL Intravenous Q12H   Continuous Infusions:  PRN Meds:.acetaminophen, acetaminophen, ondansetron (ZOFRAN) IV, ondansetron, DISCONTD: sodium chloride, DISCONTD: sodium chloride  Ht:  unknown, RD unable to obtain  Wt:  40.7 kg  Ideal Wt:    unknown % Ideal Wt:  Usual Wt: same per group home staff % Usual Wt: 100%  There is no height or weight on file to calculate BMI.  Food/Nutrition Related Hx: dependent feeder, ALF  Labs:  CMP     Component Value Date/Time   NA 140 12/13/2011 2246   K 3.7 12/13/2011 2246   CL 107 12/13/2011 2246   CO2 22 12/13/2011 2246   GLUCOSE 104* 12/13/2011 2246   BUN 24* 12/13/2011 2246   CREATININE 0.48* 12/14/2011 0039   CALCIUM 8.7 12/13/2011 2246   PROT 4.9* 10/23/2008 0045   ALBUMIN 3.2* 10/23/2008 0045   AST 23 10/23/2008 0045   ALT 27 10/23/2008 0045   ALKPHOS 93 10/23/2008 0045   BILITOT 0.9 10/23/2008 0045   GFRNONAA >90 12/14/2011 0039   GFRAA >90 12/14/2011 0039    Intake: variable per NT Output:  No intake or output data in the 24 hours ending 12/14/11 1240   Diet Order: Dysphagia 1, thin  Supplements/Tube Feeding:  none at this time  IVF:    Estimated Nutritional Needs:   Kcal: 1140-1300 Protein: 32-40g Fluid: >1.2 L/day  Pt admitted for observation with cough and low grade fever.  Pt with h/o of mental retardation and spastic quadriparesis.  Pt is a dependent feeder.  NT reports pt tolerated breakfast well and consumed >50%, however intake was decreased at lunch today. NT reports that pt's tongue appears large for mouth and suspects pt has a diffcult time moving food from mouth to throat for swallow; moderate amount of food is spit out.    RD spoke to RN at Group Home who reports pt is on a Puree with pudding thick liquids.  Pt typically feeds in her chair which is the best way to get her to sit straight up.  Pt is served double portions due to increased waste.  Wt recently stable: 84 lbs 2013 88 lbs 2012  Pt appears thin, chronically underweight, with difficulty feeding at baseline AEB  difficulty chewing/swallowing requiring dysphagia diet.    Noted order for SLP eval.  NUTRITION DIAGNOSIS: -Biting/Chewing (NI-1.2).  Status: Ongoing  RELATED TO: muscle coordination, enlarged tongue  AS EVIDENCE BY: pt with severe mental retardation/cerebral palsy, spits food out.  MONITORING/EVALUATION(Goals): 1.  Food/Beverage; pt to consume  adequate kcal to maintain wt.  EDUCATION NEEDS: -No education needs identified at this time  INTERVENTION: 1.  Supplements; Ensure once daily with lunch meal thickened to appropriate consistency.   Dietitian #: [phone]  DOCUMENTATION CODES Per approved criteria  -Severe Malnutrition of Chronic Illness    Loyce Dys [facility] 12/14/2011, 12:38 PM

## 2011-12-14 NOTE — Progress Notes (Signed)
SPEECH PATHOLOGY  Received order for eval and treat, however, per notes, it appears a swallow assessment is wanted.  Spoke with RN who stated she will obtain order for bedside swallow. PLAN:  Swallow assessment will be initiated tomorrow.  Pt. Is on a Dys diet.  If difficulties observed prior to SLP tomorrows assessment, would recommend modifying diet texture or liquids to thicker consistency or making pt. NPO.  Breck Coons [employer] [phone]  12/14/2011

## 2011-12-14 NOTE — H&P (Signed)
PCP:  No primary provider on file.  Cannot assess  Chief Complaint:  Coughing, low grade temp  HPI: 55yoF with h/o severe mental retardation/cerebral palsy,  spastic quadriparesis, h/o seizures, nonverbal,  noncommunicative presents with low grade fevers and  retrocardiac opacity.   Pt unable to give history but per discussion with ED staff, she  lives at an intermediate care facility and history was gathered  from nurse there, who stated pt has increased tremors that was  treated with Ativan without relief, also Tmax was 99.8, with  some cough as well.    In the ED, vitals were stable, Tmax 99, BP min 99/57. Labs with  normal chem panel except renal minimally abnormal 24 / 0.52,  BNP normal, CBC unremarkable. VBG was normal. Gyn exam with  WBC's on wet prep. UA with minimal ketones but otherwise  negative. Strep pneumo urinary Ag negative. CXR with mild  perihilar/retrocardiac opacity, edema vs infiltrate. Edema felt  to be not likely esp. with completely normal BNP and PNA more  likely, therefore pt was given 1g ceftriaxone, ordered  azithromycin. Observation was requested to monitor her fever  curve, overall stability given her inability to communicate.  ROS otherwise not obtainable.    Past Medical History  Diagnosis Date  . Seizures   . Mental retardation   . Encephalitis   . Cerebral palsy   . Spastic Paresis     No past surgical history on file.  Medications:  HOME MEDS: Prior to Admission medications   Medication Sig Start Date End Date Taking? Authorizing Provider  CASCARA SAGRADA PO Take 1,500 mg by mouth daily.   Yes Historical Provider, MD  Chlorpheniramine Maleate (CHLOR-TRIMETON PO) Take 1 tablet by mouth every 8 (eight) hours as needed. For allergies   Yes Historical Provider, MD  LORazepam (ATIVAN) 1 MG tablet Take 1 mg by mouth 3 (three) times daily.   Yes Historical Provider, MD  nitrofurantoin (MACRODANTIN) 100 MG capsule Take 100 mg by mouth at  bedtime.   Yes Historical Provider, MD  Pediatric Multiple Vitamins (MULTIVITAMIN) LIQD Take 5 mLs by mouth daily.   Yes Historical Provider, MD  sodium phosphate (FLEET) enema Place 1 enema rectally once as needed. For constipation   Yes Historical Provider, MD  traMADol (ULTRAM) 50 MG tablet Take 25 mg by mouth 4 (four) times daily as needed. For pain   Yes Historical Provider, MD    Allergies:  Allergies  Allergen Reactions  . Pseudoephedrine Other (See Comments)    Reaction unknown  . Sympathomimetics Other (See Comments)    Reaction unknown    Social History:   does not have a smoking history on file. She does not have any smokeless tobacco history on file. Her alcohol and drug histories not on file.  Family History: No family history on file.  Physical Exam: Filed Vitals:   12/14/11 0030 12/14/11 0036 12/14/11 0102 12/14/11 0230  BP: 100/60  99/57 111/67  Pulse:   75 70  Temp:   98.1 F (36.7 C) 97.9 F (36.6 C)  TempSrc:   Oral Oral  Resp:   20 20  SpO2: 96% 95% 95% 94%   Blood pressure 111/67, pulse 70, temperature 97.9 F (36.6 C), temperature source Oral, resp. rate 20, SpO2 94.00%. Gen: Thin, non verbal, non communicative F with obvious spastic  limbs, looking around wild eyed but unable to make any noise or  have any meaningful interaction. Is calm and breathing  comfortably, not in obvious  distress. Overall very difficult  and limited exam HEENT: Pupils round, equal, sclera clear, EOMI grossly. Mouth  unable to examine Lungs: Grossly clear but difficult exam, unable to sit forward,  unable to command to breathe.  Heart: Very hard to hear S1/2, but cannot hear any adventitious  sounds Abd: Soft, not rigid or peritoneal, no facial grimacing to  palpation.  Extrem: Thin, warm, perfusing normally, in spastic position,  but can manipulate and extend. BLE's curled up in flexed  position throughout Neuro: Looks around and is alert and moving extremties    chaotically and randomly, but otherwise unable to fully assess   Labs & Imaging Results for orders placed during the hospital encounter of 12/13/11 (from the past 48 hour(s))  CBC     Status: Abnormal   Collection Time   12/13/11 10:46 PM      Component Value Range Comment   WBC 7.4  4.0 - 10.5 (K/uL)    RBC 3.59 (*) 3.87 - 5.11 (MIL/uL)    Hemoglobin 11.8 (*) 12.0 - 15.0 (g/dL)    HCT 11.9 (*) 14.7 - 46.0 (%)    MCV 94.7  78.0 - 100.0 (fL)    MCH 32.9  26.0 - 34.0 (pg)    MCHC 34.7  30.0 - 36.0 (g/dL)    RDW 82.9  56.2 - 13.0 (%)    Platelets 144 (*) 150 - 400 (K/uL)   DIFFERENTIAL     Status: Normal   Collection Time   12/13/11 10:46 PM      Component Value Range Comment   Neutrophils Relative 57  43 - 77 (%)    Neutro Abs 4.2  1.7 - 7.7 (K/uL)    Lymphocytes Relative 34  12 - 46 (%)    Lymphs Abs 2.5  0.7 - 4.0 (K/uL)    Monocytes Relative 8  3 - 12 (%)    Monocytes Absolute 0.6  0.1 - 1.0 (K/uL)    Eosinophils Relative 1  0 - 5 (%)    Eosinophils Absolute 0.1  0.0 - 0.7 (K/uL)    Basophils Relative 0  0 - 1 (%)    Basophils Absolute 0.0  0.0 - 0.1 (K/uL)   BASIC METABOLIC PANEL     Status: Abnormal   Collection Time   12/13/11 10:46 PM      Component Value Range Comment   Sodium 140  135 - 145 (mEq/L)    Potassium 3.7  3.5 - 5.1 (mEq/L)    Chloride 107  96 - 112 (mEq/L)    CO2 22  19 - 32 (mEq/L)    Glucose, Bld 104 (*) 70 - 99 (mg/dL)    BUN 24 (*) 6 - 23 (mg/dL)    Creatinine, Ser 8.65  0.50 - 1.10 (mg/dL)    Calcium 8.7  8.4 - 10.5 (mg/dL)    GFR calc non Af Amer >90  >90 (mL/min)    GFR calc Af Amer >90  >90 (mL/min)   PRO B NATRIURETIC PEPTIDE     Status: Normal   Collection Time   12/13/11 10:46 PM      Component Value Range Comment   Pro B Natriuretic peptide (BNP) 63.2  0 - 125 (pg/mL)   URINALYSIS, ROUTINE W REFLEX MICROSCOPIC     Status: Abnormal   Collection Time   12/13/11 10:53 PM      Component Value Range Comment   Color, Urine AMBER (*) YELLOW   BIOCHEMICALS MAY BE AFFECTED BY COLOR  APPearance CLOUDY (*) CLEAR     Specific Gravity, Urine 1.031 (*) 1.005 - 1.030     pH 5.5  5.0 - 8.0     Glucose, UA NEGATIVE  NEGATIVE (mg/dL)    Hgb urine dipstick NEGATIVE  NEGATIVE     Bilirubin Urine NEGATIVE  NEGATIVE     Ketones, ur 15 (*) NEGATIVE (mg/dL)    Protein, ur NEGATIVE  NEGATIVE (mg/dL)    Urobilinogen, UA 0.2  0.0 - 1.0 (mg/dL)    Nitrite NEGATIVE  NEGATIVE     Leukocytes, UA NEGATIVE  NEGATIVE  MICROSCOPIC NOT DONE ON URINES WITH NEGATIVE PROTEIN, BLOOD, LEUKOCYTES, NITRITE, OR GLUCOSE <1000 mg/dL.  STREP PNEUMONIAE URINARY ANTIGEN     Status: Normal   Collection Time   12/13/11 10:53 PM      Component Value Range Comment   Strep Pneumo Urinary Antigen NEGATIVE  NEGATIVE    WET PREP, GENITAL     Status: Abnormal   Collection Time   12/13/11 10:54 PM      Component Value Range Comment   Yeast Wet Prep HPF POC NONE SEEN  NONE SEEN     Trich, Wet Prep NONE SEEN  NONE SEEN     Clue Cells Wet Prep HPF POC NONE SEEN  NONE SEEN     WBC, Wet Prep HPF POC FEW (*) NONE SEEN    POCT I-STAT 3, BLOOD GAS (G3P V)     Status: Abnormal   Collection Time   12/13/11 11:02 PM      Component Value Range Comment   pH, Ven 7.385 (*) 7.250 - 7.300     pCO2, Ven 39.2 (*) 45.0 - 50.0 (mmHg)    pO2, Ven 54.0 (*) 30.0 - 45.0 (mmHg)    Bicarbonate 23.5  20.0 - 24.0 (mEq/L)    TCO2 25  0 - 100 (mmol/L)    O2 Saturation 87.0      Acid-base deficit 1.0  0.0 - 2.0 (mmol/L)    Sample type VENOUS     CBC     Status: Abnormal   Collection Time   12/14/11 12:39 AM      Component Value Range Comment   WBC 7.4  4.0 - 10.5 (K/uL)    RBC 3.56 (*) 3.87 - 5.11 (MIL/uL)    Hemoglobin 11.5 (*) 12.0 - 15.0 (g/dL)    HCT 78.2 (*) 95.6 - 46.0 (%)    MCV 95.2  78.0 - 100.0 (fL)    MCH 32.3  26.0 - 34.0 (pg)    MCHC 33.9  30.0 - 36.0 (g/dL)    RDW 21.3  08.6 - 57.8 (%)    Platelets 143 (*) 150 - 400 (K/uL)   CREATININE, SERUM     Status: Abnormal    Collection Time   12/14/11 12:39 AM      Component Value Range Comment   Creatinine, Ser 0.48 (*) 0.50 - 1.10 (mg/dL)    GFR calc non Af Amer >90  >90 (mL/min)    GFR calc Af Amer >90  >90 (mL/min)    Dg Chest 2 View  12/13/2011  *RADIOLOGY REPORT*  Clinical Data: Fever  CHEST - 2 VIEW  Comparison: 10/22/2008  Findings: Degraded by patient rotation and hypoaeration. Perihilar/retrocardiac opacity, mild.  No pneumothorax.  No pleural effusion.  Curvature of the thoracolumbar spine.  Diffuse osteopenia.  Limited evaluation for fracture.  IMPRESSION: Mild perihilar/retrocardiac opacity; edema versus infiltrate.  Original Report Authenticated By: Waneta Martins, M.D.  Impression Present on Admission:  .Community acquired pneumonia  55yoF with h/o severe mental retardation/cerebral palsy,  spastic quadriparesis, h/o seizures, nonverbal,  noncommunicative presents with low grade fevers and  retrocardiac opacity.   1. Low grade fever, mild abnormal CXR: Possible PNA. Has low  grade temps but no WBC count. Despite healthcare resident,  don't overall feel she is that ill and warrants full HCAP  coverage, so will stick to CAP for now - Admit observation  - CTX and azithromycin  2. H/o cerebral palsy: pureed diet.   3. Holding all home meds for now.   Lovenox Regular bed, MC team 2 Presumed full code  Other plans as per orders.  Mesa Janus 12/14/2011, 5:06 AM

## 2011-12-14 NOTE — Progress Notes (Signed)
Utilization review complete 

## 2011-12-15 ENCOUNTER — Observation Stay (HOSPITAL_COMMUNITY): Payer: Medicaid Other

## 2011-12-15 DIAGNOSIS — R509 Fever, unspecified: Secondary | ICD-10-CM

## 2011-12-15 DIAGNOSIS — G809 Cerebral palsy, unspecified: Secondary | ICD-10-CM

## 2011-12-15 DIAGNOSIS — D696 Thrombocytopenia, unspecified: Secondary | ICD-10-CM

## 2011-12-15 LAB — BASIC METABOLIC PANEL
Calcium: 8.7 mg/dL (ref 8.4–10.5)
GFR calc Af Amer: >90 mL/min (ref >90)
GFR calc non Af Amer: >90 mL/min (ref >90)
Potassium: 3.5 mEq/L (ref 3.5–5.1)
Sodium: 145 mEq/L (ref 135–145)

## 2011-12-15 LAB — CBC
Hemoglobin: 11.2 g/dL — ABNORMAL LOW (ref 12.0–15.0)
MCH: 31.5 pg (ref 26.0–34.0)
Platelets: 123 10*3/uL — ABNORMAL LOW (ref 150–400)
RBC: 3.56 MIL/uL — ABNORMAL LOW (ref 3.87–5.11)
WBC: 4.3 10*3/uL (ref 4.0–10.5)

## 2011-12-15 LAB — GC/CHLAMYDIA PROBE AMP, GENITAL: GC Probe Amp, Genital: NEGATIVE

## 2011-12-15 MED ORDER — LEVOFLOXACIN 750 MG PO TABS: 750.0000 mg | ORAL_TABLET | Freq: Every day | ORAL | Status: AC

## 2011-12-15 NOTE — Progress Notes (Signed)
Clinical Social Work  CSW faxed FL2 and dc summary to [facility] group home. Group home reviewed information and is agreeable to patient returning at dc. Group home will transport patient back to group home. CSW spoke with RN who stated patient is ready to dc. CSW spoke with group home and informed them that patient is ready to dc. CSW placed chart copy in [facility] and informed RN to give to group home. CSW is signing off.  [place], [place] [phone] (Coverage)

## 2011-12-15 NOTE — Discharge Summary (Signed)
Date of Admission: 12/13/2011  8:01 PM Admitter: @ADMITPROV @   Date of Discharge5/08/2011 Attending Physician: Rhetta Mura, MD  Things to Follow-up on: Might need aspiration precautions-needs Dys1 diet, honey thick liquids Complete 5 day course of Empiric CAP coverage-levaquin 750mg  for 5 days Get a bmet in 5-7 days     [facility] Hospital Discharge Summary 55yoF with h/o severe mental retardation/cerebral palsy, spastic quadriparesis, h/o seizures, nonverbal, noncommunicative presents with low grade fevers and retrocardiac opacity.   Pt unable to give history but per discussion with ED staff, she lives at an intermediate care facility and history was gathered  from nurse there, who stated pt has increased tremors that was treated with Ativan without relief, also Tmax was 99.8, with  some cough as well.    In the ED, vitals were stable, Tmax 99, BP min 99/57. Labs with normal chem panel except renal minimally abnormal 24 / 0.52,  BNP normal, CBC unremarkable. VBG was normal. Gyn exam with WBC's on wet prep, done 2/2 to a vaginal discharge. UA with minimal ketones but otherwise  negative. Strep pneumo urinary Ag negative. CXR with mild perihilar/retrocardiac opacity, edema vs infiltrate. Edema felt to be not likely esp. with completely normal BNP and PNA more likely, therefore pt was given 1g ceftriaxone, ordered azithromycin. Observation was requested to monitor her fever curve, overall stability given her inability to communicate.  ROS otherwise not obtainable.    Patient seen and examined on day of d/c.  Not able to get review of systems.   BP 121/101  Pulse 73  Temp(Src) 98.7 F (37.1 C) (Oral)  Resp 20  Wt 40.7 kg (89 lb 11.6 oz)  SpO2 90% SLP in room and noted some difficulty obtaining proper swallow, hence a MBS was done  Which did not denote significant aspiration, but diffculty with liquids.  Recommendation per SLP was that patient be on a Dysphagia 1, Honey thick  liquid consistency diet.      Hospital Course by problem list: CAP-Her PNA was not thought to be severe, given lack of WBC or fever, howvere a bedside evaluation by speech pathology showed some concerns for Aspiration, raisinf possibility for Aspiration PNA.  Nonetheless, despite her being a NH resident, she will be covered fro 5 days with PO levaquin as for CAP Rx.  Dysphagia-see above  Renal insufficiency BMEt showed a BUN of 24.  This came down with IV fluid repletion  Procedures Performed and pertinent labs: Dg Chest 2 View  12/13/2011  *RADIOLOGY REPORT*  Clinical Data: Fever  CHEST - 2 VIEW  Comparison: 10/22/2008  Findings: Degraded by patient rotation and hypoaeration. Perihilar/retrocardiac opacity, mild.  No pneumothorax.  No pleural effusion.  Curvature of the thoracolumbar spine.  Diffuse osteopenia.  Limited evaluation for fracture.  IMPRESSION: Mild perihilar/retrocardiac opacity; edema versus infiltrate.  Original Report Authenticated By: Waneta Martins, M.D.    Discharge Vitals & PE:  BP 121/101  Pulse 73  Temp(Src) 98.7 F (37.1 C) (Oral)  Resp 20  Wt 40.7 kg (89 lb 11.6 oz)  SpO2 90% Alert.  Chest clear.  No added sounds s1 s2 cannot preciate murmur given lack of patient cooperation Cn 2-12 grossly at prior baseline.  Moving all 4 limbs  Discharge Labs:  Results for orders placed during the hospital encounter of 12/13/11 (from the past 24 hour(s))  BASIC METABOLIC PANEL     Status: Normal   Collection Time   12/15/11  6:09 AM  Component Value Range   Sodium 145  135 - 145 (mEq/L)   Potassium 3.5  3.5 - 5.1 (mEq/L)   Chloride 112  96 - 112 (mEq/L)   CO2 25  19 - 32 (mEq/L)   Glucose, Bld 89  70 - 99 (mg/dL)   BUN 20  6 - 23 (mg/dL)   Creatinine, Ser 1.19  0.50 - 1.10 (mg/dL)   Calcium 8.7  8.4 - 14.7 (mg/dL)   GFR calc non Af Amer >90  >90 (mL/min)   GFR calc Af Amer >90  >90 (mL/min)  CBC     Status: Abnormal   Collection Time   12/15/11  6:09 AM        Component Value Range   WBC 4.3  4.0 - 10.5 (K/uL)   RBC 3.56 (*) 3.87 - 5.11 (MIL/uL)   Hemoglobin 11.2 (*) 12.0 - 15.0 (g/dL)   HCT 82.9 (*) 56.2 - 46.0 (%)   MCV 93.8  78.0 - 100.0 (fL)   MCH 31.5  26.0 - 34.0 (pg)   MCHC 33.5  30.0 - 36.0 (g/dL)   RDW 13.0  86.5 - 78.4 (%)   Platelets 123 (*) 150 - 400 (K/uL)    Disposition and follow-up:   [name] was discharged from in fair condition.    Follow-up Appointments:    Discharge Medications: Medication List  As of 12/15/2011  8:47 AM   STOP taking these medications         nitrofurantoin 100 MG capsule         TAKE these medications         CASCARA SAGRADA PO   Take 1,500 mg by mouth daily.      CHLOR-TRIMETON PO   Take 1 tablet by mouth every 8 (eight) hours as needed. For allergies      levofloxacin 750 MG tablet   Commonly known as: LEVAQUIN   Take 1 tablet (750 mg total) by mouth daily.      LORazepam 1 MG tablet   Commonly known as: ATIVAN   Take 1 mg by mouth 3 (three) times daily.      multivitamin Liqd   Take 5 mLs by mouth daily.      sodium phosphate enema   Commonly known as: FLEET   Place 1 enema rectally once as needed. For constipation      traMADol 50 MG tablet   Commonly known as: ULTRAM   Take 25 mg by mouth 4 (four) times daily as needed. For pain           Medications Discontinued During This Encounter  Medication Reason  . 0.9 %  sodium chloride infusion   . enoxaparin (LOVENOX) injection 40 mg   . sodium chloride 0.9 % injection 3 mL   . sodium chloride 0.9 % injection 3 mL   . nitrofurantoin (MACRODANTIN) 100 MG capsule Stop Taking at Discharge    > 40 minutes time spent preparing d/c summary, including direct face-face patient Time, contact with consultants, family and care coordination   Signed: Kaamil Morefield,JAI 12/15/2011, 8:47 AM

## 2011-12-15 NOTE — Progress Notes (Signed)
Clinical Social Work Department BRIEF PSYCHOSOCIAL ASSESSMENT 12/15/2011  Patient:  LYNDZIE, ZENTZ     Account Number:  [id number]     Admit date:  12/13/2011  Clinical Social Worker:  Andres Shad  Date/Time:  12/15/2011 01:51 PM  Referred by:  Physician  Date Referred:  12/15/2011 Referred for  ALF Placement   Other Referral:   Return to Group Home   Interview type:  Other - See comment Other interview type:   Patient has severe MR, spoke with patient guardian and also facility/group home    PSYCHOSOCIAL DATA Living Status:  FACILITY Admitted from facility:  [facility] Level of care:  Assisted Living Primary support name:  Chassidy Primary support relationship to patient:  NONE Degree of support available:   Patient is ward of the [place], guardian.  Supportive.  Facility is also very well known to patient and also supportive.    CURRENT CONCERNS Current Concerns  Post-Acute Placement   Other Concerns:   None    SOCIAL WORK ASSESSMENT / PLAN Received referral for patient from a group home and medically stable for return.  Spoke with patient who is pleasant but unable to make medically decisions.  Spoke with DSS guardian of patient who was called and agreeable for patient to return.  Spoke with group home with regards to patient admission and patient is a long term patient and agreeable to accept patient back, pending medical review from facility MD.  Not anticipating any level of care changes.   Assessment/plan status:  Psychosocial Support/Ongoing Assessment of Needs Other assessment/ plan:   na   Information/referral to community resources:   updated FL2    PATIENT'S/FAMILY'S RESPONSE TO PLAN OF CARE: All agreeable to DC plan.    Ashley Jacobs, MSW LCSW [phone]

## 2011-12-15 NOTE — Progress Notes (Signed)
   CARE MANAGEMENT NOTE 12/15/2011  Patient:  [name], [name]   Account Number:  [id number]  Date Initiated:  12/15/2011  Documentation initiated by:  Letha Cape  Subjective/Objective Assessment:   dx pna  admit- from group home ([facility])     Action/Plan:   Anticipated DC Date:  12/15/2011   Anticipated DC Plan:  HOME/SELF CARE      DC Planning Services  CM consult      Choice offered to / List presented to:             Status of service:  In process, will continue to follow Medicare Important Message given?   (If response is "NO", the following Medicare IM given date fields will be blank) Date Medicare IM given:   Date Additional Medicare IM given:    Discharge Disposition:    Per UR Regulation:    If discussed at Long Length of Stay Meetings, dates discussed:    Comments:  12/15/11 15:32 Letha Cape RN, BSN [phone] patient is from [facility] group home. Patient for MBS today . CSW following.

## 2011-12-15 NOTE — Procedures (Signed)
Objective Swallowing Evaluation: Modified Barium Swallowing Study  Patient Details  Name: [name] MRN: [id number] Date of Birth: [birth year]  Today's Date: 12/15/2011 Time: 0940-1000 SLP Time Calculation (min): 20 min  HPI:  56 yr old admitted with cough, fever.  History of severe mental retardation and CP.  No documented prior MBS found.  CXR with mild opacity, edema versus infiltrate.  Pt. somewhat contracted but able to straighten legs with assistand.  MBS would be beneficial to have a baseline function of swallow ability for likely readmissions for dysphagia and or pna.  Past Medical History:  Past Medical History  Diagnosis Date  . Seizures   . Mental retardation   . Encephalitis   . Cerebral palsy   . Spastic Paresis    Past Surgical History: No past surgical history on file.  Assessment / Plan / Recommendation Clinical Impression  Dysphagia Diagnosis: Severe oral phase dysphagia Clinical impression: Pt. exhibited severe oral phase dysphagia with absent labial seal on utensil and cup, decreased lingual manipulation/coordination, lingual pumping, constant lingual movement resulting in significant labial leakage bilaterally (primarily left side), delayed oral transit, poor A-P propulsion.  SLP placed spoon into right side of oral cavity and positioned food onto hard palate to aid in bolus containment and propusion.  Thin liquids spilled out of oral cavity due to pt.'s inability to orally prep bolus and contain using spoon, cup and straw.  Pt.'s pharyngeal phase appeared functional although limited trials observed.  Overall, timing of swallow was adequate other than one episode of swallow initiation at the pyriform sinsuses.  Functional pharyngeal contraction, laryngeal elevation and epiglottic deflection appeared fucntion without observation of material entering laryngeal vestibule.  Question pt.'s ability to meet nutritional needs with severe oral dysphagia.   No family present to  discuss prognosis related to swallowing.  SLP attempted to contact SLP or RN at group home unsuccessfully.     Treatment Recommendation  Therapy as outlined in treatment plan below    Diet Recommendation Dysphagia 1 (Puree);Honey-thick liquid   Other  Recommendations MBS   Follow Up Recommendations  Skilled Nursing facility    Frequency and Duration min 2x/week  2 weeks       SLP Swallow Goals Patient will consume recommended diet without observed clinical signs of aspiration with: Maximal cueing Patient will utilize recommended strategies during swallow to increase swallowing safety with: Maximal cueing   General HPI: 56 yr old admitted with cough, fever.  History of severe mental retardation and CP.  No documented prior MBS found.  CXR with mild opacity, edema versus infiltrate.  Pt. somewhat contracted but able to straighten legs with assistand.  MBS would be beneficial to have a baseline function of swallow ability for likely readmissions for dysphagia and or pna. Type of Study: Modified Barium Swallowing Study Previous Swallow Assessment:  (none found) Diet Prior to this Study: Dysphagia 1 (puree);Thin liquids Temperature Spikes Noted: No Respiratory Status: Supplemental O2 delivered via (comment) Behavior/Cognition: Alert;Requires cueing;Doesn't follow directions;Decreased sustained attention Oral Cavity - Dentition: Poor condition Oral Motor / Sensory Function: Impaired - see Bedside swallow eval Vision: Impaired for self-feeding Patient Positioning: Upright in chair Baseline Vocal Quality: Clear Volitional Cough: Cognitively unable to elicit Volitional Swallow: Unable to elicit Anatomy: Within functional limits Pharyngeal Secretions: Not observed secondary MBS       Oral Phase     Pharyngeal Phase Pharyngeal Phase: Impaired   Cervical Esophageal Phase Cervical Esophageal Phase: [name]   [name].Ed Personnel officer  [phone]  12/15/2011  Royce Macadamia 12/15/2011, 10:43 AM

## 2011-12-15 NOTE — Progress Notes (Signed)
Called to speak with Cindee Salt [phone], who is her legal guardian (no family available)-patient stays at [facility] house, # [phone] Which is like a group home-main contact = Jerlyn Ly + Mr Randie Heinz. Updated her that this is unlikely Pneumonia and could be malrotation of film and could be empirically Rx with Levaquin/Azithromycin. Ms Coralee North expressed good understanding.

## 2011-12-15 NOTE — Evaluation (Signed)
Clinical/Bedside Swallow Evaluation Patient Details  Name: [name] MRN: [id number] Date of Birth: [birth year]  Today's Date: 12/15/2011 Time: 0830-0850 SLP Time Calculation (min): 20 min  HPI:  56 yr old admitted with cough, fever.  History of severe mental retardation and CP.  No documented prior MBS found.  CXR with mild opacity, edema versus infiltrate  Past Medical History:  Past Medical History  Diagnosis Date  . Seizures   . Mental retardation   . Encephalitis   . Cerebral palsy   . Spastic Paresis    Past Surgical History: No past surgical history on file. Assessment / Plan / Recommendation Clinical Impression  Pt. exhibiting severe oral dysphagia with continuous mandible, labial, oral loss of saliva and lingual movements characteristic of CP leading to leakage of majority of bolus.  Pharyngeal swallow is described as decreased laryngeal elevation upon palpation and a loud, audible swallow possibly indicative of poor pharyngeal strength and coordination.  It is recommended that pt. have a baseline MBS as aspiration prevelence is high in pt.'s with CP with likely future admissions for pna.  A baseline MBS would determine pt.'s current swallow function and aid in plan of treatment for future care.  SLP called pt.'s group home without success in contacting the Speech Pathologist or RN familiar with pt.      Aspiration Risk  Severe    Diet Recommendation  (Pt. heading to XRAY at present)   Other  Recommendations MBS   Follow Up Recommendations       Frequency and Duration        Pertinent Vitals/Pain     Swallow Study Prior Functional Status       General HPI: 56 yr old admitted with cough, fever.  History of severe mental retardation and CP.  No documented prior MBS found.  CXR with mild opacity, edema versus infiltrate Type of Study: Bedside swallow evaluation Previous Swallow Assessment:  (none found) Diet Prior to this Study: Dysphagia 1 (puree);Thin  liquids Temperature Spikes Noted: No Respiratory Status: Supplemental O2 delivered via (comment) Behavior/Cognition: Alert;Requires cueing;Doesn't follow directions;Decreased sustained attention Oral Cavity - Dentition: Poor condition Vision: Impaired for self-feeding Patient Positioning: Upright in bed Baseline Vocal Quality: Clear Volitional Cough: Cognitively unable to elicit Volitional Swallow: Unable to elicit    Oral/Motor/Sensory Function Overall Oral Motor/Sensory Function: Impaired (unable to formally assess)   Ice Chips Ice chips: Not tested   Thin Liquid Thin Liquid: Impaired Presentation: Cup;Spoon;Straw Oral Phase Impairments: Reduced labial seal;Reduced lingual movement/coordination;Poor awareness of bolus Oral Phase Functional Implications: Right anterior spillage;Left anterior spillage;Oral residue Pharyngeal  Phase Impairments: Decreased hyoid-laryngeal movement (loud swallow)    Nectar Thick Nectar Thick Liquid: Not tested   Honey Thick Honey Thick Liquid: Not tested   Puree Puree: Impaired Presentation: Spoon Oral Phase Impairments: Reduced labial seal;Reduced lingual movement/coordination;Impaired anterior to posterior transit;Poor awareness of bolus Oral Phase Functional Implications: Left anterior spillage;Oral residue;Oral holding Pharyngeal Phase Impairments: Decreased hyoid-laryngeal movement (loud swallow)   Solid Solid: Not tested   Royce Macadamia M.Ed CCC-SLP Pager [phone]  12/15/2011  Royce Macadamia 12/15/2011,9:16 AM

## 2011-12-16 NOTE — Progress Notes (Signed)
   CARE MANAGEMENT NOTE 12/16/2011  Patient:  [name], [name]   Account Number:  [id number]  Date Initiated:  12/15/2011  Documentation initiated by:  Letha Cape  Subjective/Objective Assessment:   dx pna  admit- from group home ([facility])     Action/Plan:   Anticipated DC Date:  12/15/2011   Anticipated DC Plan:  GROUP HOME      DC Planning Services  CM consult      Choice offered to / List presented to:             Status of service:  Completed, signed off Medicare Important Message given?   (If response is "NO", the following Medicare IM given date fields will be blank) Date Medicare IM given:   Date Additional Medicare IM given:    Discharge Disposition:  GROUP HOME  Per UR Regulation:    If discussed at Long Length of Stay Meetings, dates discussed:    Comments:  12/15/11 15:32 Letha Cape RN, BSN [phone] patient is from [facility] group home. Patient for MBS today . CSW following.  Patient dc 'd to group home.

## 2012-09-07 ENCOUNTER — Other Ambulatory Visit (HOSPITAL_COMMUNITY): Payer: Medicaid Other

## 2013-06-07 ENCOUNTER — Other Ambulatory Visit (HOSPITAL_COMMUNITY): Payer: Self-pay | Admitting: Family Medicine

## 2013-06-07 DIAGNOSIS — I313 Pericardial effusion (noninflammatory): Secondary | ICD-10-CM

## 2013-06-19 ENCOUNTER — Ambulatory Visit (HOSPITAL_COMMUNITY): Payer: Medicaid Other

## 2013-06-26 ENCOUNTER — Ambulatory Visit (HOSPITAL_COMMUNITY)
Admission: RE | Admit: 2013-06-26 | Discharge: 2013-06-26 | Disposition: A | Discharge status: Home / Self Care | Payer: Medicaid Other | Source: Ambulatory Visit | Attending: Family Medicine | Admitting: Family Medicine

## 2013-06-26 DIAGNOSIS — R569 Unspecified convulsions: Secondary | ICD-10-CM | POA: Insufficient documentation

## 2013-06-26 DIAGNOSIS — G809 Cerebral palsy, unspecified: Secondary | ICD-10-CM | POA: Insufficient documentation

## 2013-06-26 DIAGNOSIS — I313 Pericardial effusion (noninflammatory): Secondary | ICD-10-CM

## 2013-06-26 DIAGNOSIS — I319 Disease of pericardium, unspecified: Secondary | ICD-10-CM

## 2013-06-26 NOTE — Progress Notes (Signed)
2D Echo Performed 06/26/2013    Capri Veals, RCS  

## 2016-08-20 ENCOUNTER — Emergency Department (HOSPITAL_COMMUNITY): Payer: Medicaid Other

## 2016-08-20 ENCOUNTER — Inpatient Hospital Stay (HOSPITAL_COMMUNITY)
Admission: EM | Admit: 2016-08-20 | Discharge: 2016-08-23 | DRG: 871 | Disposition: A | Discharge status: Home / Self Care | Payer: Medicaid Other | Attending: Internal Medicine | Admitting: Internal Medicine

## 2016-08-20 ENCOUNTER — Encounter (HOSPITAL_COMMUNITY): Payer: Self-pay

## 2016-08-20 DIAGNOSIS — F79 Unspecified intellectual disabilities: Secondary | ICD-10-CM | POA: Diagnosis present

## 2016-08-20 DIAGNOSIS — Z888 Allergy status to other drugs, medicaments and biological substances status: Secondary | ICD-10-CM

## 2016-08-20 DIAGNOSIS — R251 Tremor, unspecified: Secondary | ICD-10-CM | POA: Diagnosis present

## 2016-08-20 DIAGNOSIS — R6250 Unspecified lack of expected normal physiological development in childhood: Secondary | ICD-10-CM | POA: Diagnosis present

## 2016-08-20 DIAGNOSIS — J189 Pneumonia, unspecified organism: Secondary | ICD-10-CM | POA: Diagnosis present

## 2016-08-20 DIAGNOSIS — J111 Influenza due to unidentified influenza virus with other respiratory manifestations: Secondary | ICD-10-CM | POA: Diagnosis present

## 2016-08-20 DIAGNOSIS — Z993 Dependence on wheelchair: Secondary | ICD-10-CM

## 2016-08-20 DIAGNOSIS — R569 Unspecified convulsions: Secondary | ICD-10-CM | POA: Diagnosis present

## 2016-08-20 DIAGNOSIS — R918 Other nonspecific abnormal finding of lung field: Secondary | ICD-10-CM

## 2016-08-20 DIAGNOSIS — Z79899 Other long term (current) drug therapy: Secondary | ICD-10-CM

## 2016-08-20 DIAGNOSIS — Z7401 Bed confinement status: Secondary | ICD-10-CM

## 2016-08-20 DIAGNOSIS — A419 Sepsis, unspecified organism: Principal | ICD-10-CM | POA: Diagnosis present

## 2016-08-20 DIAGNOSIS — E876 Hypokalemia: Secondary | ICD-10-CM | POA: Diagnosis present

## 2016-08-20 DIAGNOSIS — G801 Spastic diplegic cerebral palsy: Secondary | ICD-10-CM | POA: Diagnosis present

## 2016-08-20 DIAGNOSIS — G800 Spastic quadriplegic cerebral palsy: Secondary | ICD-10-CM | POA: Diagnosis present

## 2016-08-20 DIAGNOSIS — R69 Illness, unspecified: Secondary | ICD-10-CM

## 2016-08-20 LAB — CBC WITH DIFFERENTIAL/PLATELET
BASOS PCT: 0 %
Basophils Absolute: 0 10*3/uL (ref 0.0–0.1)
EOS ABS: 0 10*3/uL (ref 0.0–0.7)
Eosinophils Relative: 0 %
HCT: 35.9 % — ABNORMAL LOW (ref 36.0–46.0)
HEMOGLOBIN: 11.9 g/dL — AB (ref 12.0–15.0)
Lymphocytes Relative: 11 %
Lymphs Abs: 0.6 10*3/uL — ABNORMAL LOW (ref 0.7–4.0)
MCH: 31.7 pg (ref 26.0–34.0)
MCHC: 33.1 g/dL (ref 30.0–36.0)
MCV: 95.7 fL (ref 78.0–100.0)
Monocytes Absolute: 0.3 10*3/uL (ref 0.1–1.0)
Monocytes Relative: 6 %
NEUTROS PCT: 83 %
Neutro Abs: 4.5 10*3/uL (ref 1.7–7.7)
Platelets: 178 10*3/uL (ref 150–400)
RBC: 3.75 MIL/uL — AB (ref 3.87–5.11)
RDW: 12.8 % (ref 11.5–15.5)
WBC: 5.5 10*3/uL (ref 4.0–10.5)

## 2016-08-20 LAB — COMPREHENSIVE METABOLIC PANEL
ALT: 15 U/L (ref 14–54)
ANION GAP: 6 (ref 5–15)
AST: 16 U/L (ref 15–41)
Albumin: 3.8 g/dL (ref 3.5–5.0)
Alkaline Phosphatase: 74 U/L (ref 38–126)
BILIRUBIN TOTAL: 0.9 mg/dL (ref 0.3–1.2)
BUN: 28 mg/dL — ABNORMAL HIGH (ref 6–20)
CO2: 28 mmol/L (ref 22–32)
Calcium: 8.8 mg/dL — ABNORMAL LOW (ref 8.9–10.3)
Chloride: 109 mmol/L (ref 101–111)
Creatinine, Ser: 0.49 mg/dL (ref 0.44–1.00)
GFR calc non Af Amer: >60 mL/min (ref >60)
Glucose, Bld: 120 mg/dL — ABNORMAL HIGH (ref 65–99)
Potassium: 4.1 mmol/L (ref 3.5–5.1)
SODIUM: 143 mmol/L (ref 135–145)
TOTAL PROTEIN: 6.6 g/dL (ref 6.5–8.1)

## 2016-08-20 LAB — I-STAT CG4 LACTIC ACID, ED: Lactic Acid, Venous: 1.61 mmol/L (ref 0.5–1.9)

## 2016-08-20 MED ORDER — VANCOMYCIN HCL IN DEXTROSE 1-5 GM/200ML-% IV SOLN: 1000.0000 mg | Freq: Two times a day (BID) | INTRAVENOUS | Status: DC

## 2016-08-20 MED ORDER — VANCOMYCIN HCL IN DEXTROSE 1-5 GM/200ML-% IV SOLN
1000.0000 mg | Freq: Once | INTRAVENOUS | Status: AC
  Administered 2016-08-21: 1000 mg via INTRAVENOUS
  Filled 2016-08-20: qty 200

## 2016-08-20 MED ORDER — PIPERACILLIN-TAZOBACTAM 3.375 G IVPB 30 MIN
3.3750 g | Freq: Once | INTRAVENOUS | Status: AC
  Administered 2016-08-20: 3.375 g via INTRAVENOUS
  Filled 2016-08-20: qty 50

## 2016-08-20 MED ORDER — SODIUM CHLORIDE 0.9 % IV BOLUS (SEPSIS)
2000.0000 mL | INTRAVENOUS | Status: AC
  Administered 2016-08-20: 2000 mL via INTRAVENOUS

## 2016-08-20 MED ORDER — PIPERACILLIN-TAZOBACTAM 3.375 G IVPB: 3.3750 g | Freq: Three times a day (TID) | INTRAVENOUS | Status: DC

## 2016-08-20 NOTE — ED Notes (Signed)
Pt's caregiver reports pt has been having uncontrolled tremors and redness around her neck d/t to her scoliosis.  Pt has CP.  She is non-verbal.  Caregiver also reports pt has refused to eat or drink and noticed to have a fever today.

## 2016-08-20 NOTE — ED Notes (Signed)
Tried 2x to get bp but with her tremors it would not read

## 2016-08-20 NOTE — ED Triage Notes (Signed)
[facility] and brought in by a caregiver. They stated that her temp was 100 degrees. She also has been refusing to eat and has new onset tremors per caregiver. Denies N/V/D. Pt is non verbal and requires total care.

## 2016-08-20 NOTE — ED Provider Notes (Signed)
[facility]-EMERGENCY DEPT Provider Note   CSN: [id number] Arrival date & time: 08/20/16  2039  By signing my name below, I, Linna Darner, attest that this documentation has been prepared under the direction and in the presence [employer], PA-C. Electronically Signed: Linna Darner, Scribe. 08/20/2016. 9:30 PM.   History   Chief Complaint Chief Complaint  Patient presents with  . Code Sepsis    The history is provided by a caregiver. No language interpreter was used.     HPI Comments: LEVEL 5 CAVEAT BECAUSE PATIENT IS NON-VERBAL [name] is a 61 y.o. female brought in by her caregiver, with PMHx significant for mental retardation, who presents to the Emergency Department complaining of an intermittent fever beginning earlier today. Caregiver measured pt's temperature with a temporal scanner and reports a tmax of 100 degrees. Caregiver states pt has had associated decreased appetite, decreased fluid intake, and constant tremors. Caregiver states patient does not regularly have tremors. No Tylenol or other medications/treatments tried PTA. Caregiver is unsure if patient received a flu vaccination this year. Caregiver notes there are several sick contacts in patient's facility [facility]) with similar symptoms. Per caregiver, pt denies vomiting, diarrhea, or any other associated symptoms.   Past Medical History:  Diagnosis Date  . Cerebral palsy (HCC)   . Encephalitis   . Mental retardation   . Seizures (HCC)   . Spastic paresis     Patient Active Problem List   Diagnosis Date Noted  . Sepsis (HCC) 08/21/2016  . Community acquired pneumonia 12/14/2011    History reviewed. No pertinent surgical history.  OB History    No data available       Home Medications    Prior to Admission medications   Medication Sig Start Date End Date Taking? Authorizing Provider  acetaminophen (TYLENOL) 325 MG tablet Take 650 mg by mouth every 4 (four) hours as needed for mild pain, moderate  pain, fever or headache.   Yes Historical Provider, MD  alum & mag hydroxide-simeth (MAALOX/MYLANTA) 200-200-20 MG/5ML suspension Take 15-30 mLs by mouth every 2 (two) hours as needed for indigestion or heartburn.   Yes Historical Provider, MD  bisacodyl (DULCOLAX) 10 MG suppository Place 10 mg rectally as needed for moderate constipation.   Yes Historical Provider, MD  carbamide peroxide (DEBROX) 6.5 % otic solution Place 5 drops into both ears as needed (to remove ear wax).   Yes Historical Provider, MD  chlorpheniramine (CHLOR-TRIMETON) 4 MG tablet Take 4 mg by mouth every 8 (eight) hours as needed for allergies.   Yes Historical Provider, MD  Eyelid Cleansers (OCUSOFT EYELID CLEANSING EX) Place 1 application into both eyes every evening.   Yes Historical Provider, MD  guaifenesin (ROBITUSSIN) 100 MG/5ML syrup Take 300 mg by mouth every 4 (four) hours as needed for cough.   Yes Historical Provider, MD  hydrocortisone cream 1 % Apply 1 application topically as needed for itching (and/or bug bites).   Yes Historical Provider, MD  lip balm (CARMEX) ointment Apply 1 application topically as needed for lip care.   Yes Historical Provider, MD  loperamide (IMODIUM) 2 MG capsule Take 2-4 mg by mouth as needed for diarrhea or loose stools.   Yes Historical Provider, MD  LORazepam (ATIVAN) 1 MG tablet Take 1 mg by mouth 3 (three) times daily.   Yes Historical Provider, MD  magnesium hydroxide (MILK OF MAGNESIA) 400 MG/5ML suspension Take 15-30 mLs by mouth as needed for mild constipation.   Yes Historical Provider, MD  Miconazole Nitrate 2 % AERP Apply 1 application topically at bedtime. Pt applies to buttocks and back.   Yes Historical Provider, MD  mineral oil enema Place 1 enema rectally once as needed for severe constipation.   Yes Historical Provider, MD  neomycin-bacitracin-polymyxin (NEOSPORIN) [phone] ointment Apply 1 application topically as needed (for wound care).   Yes Historical Provider, MD    polyethylene glycol (MIRALAX / GLYCOLAX) packet Take 17 g by mouth daily.   Yes Historical Provider, MD  promethazine (PHENERGAN) 25 MG tablet Take 25 mg by mouth every 4 (four) hours as needed for nausea or vomiting.   Yes Historical Provider, MD  Skin Protectants, Misc. (MINERIN) CREA Apply 1 application topically 2 (two) times daily. Pt applies to face and arms.   Yes Historical Provider, MD    Family History History reviewed. No pertinent family history.  Social History Social History  Substance Use Topics  . Smoking status: Not on file  . Smokeless tobacco: Not on file  . Alcohol use Not on file     Allergies   Pseudoephedrine and Sympathomimetics   Review of Systems Review of Systems  Unable to perform ROS: Patient nonverbal    Physical Exam Updated Vital Signs BP 113/93   Pulse 96   Temp 99.4 F (37.4 C) (Rectal)   Resp (!) 27   Ht 5\' 3"  (1.6 m)   Wt 80.9 kg   SpO2 95%   BMI 31.58 kg/m   Physical Exam  Constitutional: She appears well-developed and well-nourished. No distress.  Patient nontoxic appearing. Does not appear in distress  HENT:  Head: Normocephalic and atraumatic.  Lingual dystonia.   Eyes: Conjunctivae and EOM are normal. No scleral icterus.  Neck: Normal range of motion.  Cardiovascular: Regular rhythm and intact distal pulses.   Mild tachycardia  Pulmonary/Chest: Effort normal. No respiratory distress. She has no wheezes. She has no rales.  No respiratory distress. Chest expansion symmetric. No wheezing or rales appreciated on exam.  Musculoskeletal: Normal range of motion.  Neurological: She is alert.  Contracture of extremities c/w spastic paresis. Moving all extremities.  Skin: Skin is warm and dry. No rash noted. She is not diaphoretic. No erythema. No pallor.  Psychiatric: She has a normal mood and affect. Her behavior is normal.  Nursing note and vitals reviewed.    ED Treatments / Results  Labs (all labs ordered are listed,  but only abnormal results are displayed) Labs Reviewed  COMPREHENSIVE METABOLIC PANEL - Abnormal; Notable for the following:       Result Value   Glucose, Bld 120 (*)    BUN 28 (*)    Calcium 8.8 (*)    All other components within normal limits  CBC WITH DIFFERENTIAL/PLATELET - Abnormal; Notable for the following:    RBC 3.75 (*)    Hemoglobin 11.9 (*)    HCT 35.9 (*)    Lymphs Abs 0.6 (*)    All other components within normal limits  CULTURE, BLOOD (ROUTINE X 2)  CULTURE, BLOOD (ROUTINE X 2)  URINE CULTURE  URINALYSIS, ROUTINE W REFLEX MICROSCOPIC  INFLUENZA PANEL BY PCR (TYPE A & B, H1N1)  I-STAT CG4 LACTIC ACID, ED  I-STAT CG4 LACTIC ACID, ED    EKG  EKG Interpretation None       Radiology Dg Chest Port 1 View  Result Date: 08/20/2016 CLINICAL DATA:  Intermittent fever EXAM: PORTABLE CHEST 1 VIEW COMPARISON:  12/13/2011 FINDINGS: Elevated left diaphragm. Right lung is grossly clear.  Mild left perihilar infiltrate. Heart size grossly within normal limits. No obvious pneumothorax. IMPRESSION: Mild left perihilar infiltrate Electronically Signed   By: Jasmine PangKim  Fujinaga M.D.   On: 08/20/2016 22:37    Procedures Procedures (including critical care time)  DIAGNOSTIC STUDIES: Oxygen Saturation is 95% on RA, adequate by my interpretation.    COORDINATION OF CARE: 9:35 PM Discussed treatment plan with pt's caregiver at bedside and she agreed to plan.  Medications Ordered in ED Medications  piperacillin-tazobactam (ZOSYN) IVPB 3.375 g (not administered)  vancomycin (VANCOCIN) IVPB 1000 mg/200 mL premix (not administered)  oseltamivir (TAMIFLU) 6 MG/ML suspension 75 mg (not administered)  acetaminophen (TYLENOL) suppository 650 mg (not administered)  sodium chloride 0.9 % bolus 2,000 mL (2,000 mLs Intravenous New Bag/Given 08/20/16 2310)  piperacillin-tazobactam (ZOSYN) IVPB 3.375 g (0 g Intravenous Stopped 08/20/16 2346)  vancomycin (VANCOCIN) IVPB 1000 mg/200 mL premix (0 mg  Intravenous Stopped 08/21/16 0111)     Initial Impression / Assessment and Plan / ED Course  I have reviewed the triage vital signs and the nursing notes.  Pertinent labs & imaging results that were available during my care of the patient were reviewed by me and considered in my medical decision making (see chart for details).  Clinical Course     61 year old female presents to the emergency department from [facility] facility with her caregiver over concern for anorexia and temperature of 100F prior to arrival. Patient febrile on arrival with tachycardia. Patient initially hypotensive; however, this responded well to IV fluids. Broad antibiotic coverage initiated on arrival. Sepsis appears secondary to a hilar infiltrate seen on CXR. Question pneumonia versus influenza. Given history of chronic medical problems, will admit for observation and further management. [facility].   Final Clinical Impressions(s) / ED Diagnoses   Final diagnoses:  Sepsis, due to unspecified organism [facility](HCC)  Pulmonary infiltrate    New Prescriptions New Prescriptions   No medications on file    I personally performed the services described in this documentation, which was scribed in my presence. The recorded information has been reviewed and is accurate.      Antony MaduraKelly Kala Gassmann, PA-C 08/21/16 0129    Donnetta HutchingBrian Cook, MD 08/22/16 [phone]

## 2016-08-20 NOTE — Progress Notes (Signed)
Pharmacy Antibiotic Note  [name] is a 61 y.o. female admitted on 08/20/2016 with sepsis.  Pharmacy has been consulted for Vancomycin and Zosyn dosing.  Scr 0.49, CrCl ~ 74 ml/min Lactic acid 1.6  Plan:  Zosyn 3.375g IV Q8H infused over 4hrs.  Vancomycin 1g IV x1 dose then 1g IV q12h.  Measure Vanc trough at steady state.  Follow up renal fxn, culture results, and clinical course.   Height: 5\' 3"  (160 cm) Weight: 178 lb 4.8 oz (80.9 kg) IBW/kg (Calculated) : 52.4  Temp (24hrs), Avg:101.4 F (38.6 C), Min:101.4 F (38.6 C), Max:101.4 F (38.6 C)   Recent Labs Lab 08/20/16 2152  LATICACIDVEN 1.61    CrCl cannot be calculated (Patient's most recent lab result is older than the maximum 21 days allowed.).    Allergies  Allergen Reactions  . Pseudoephedrine Other (See Comments)    Reaction:  Unknown   . Sympathomimetics Other (See Comments)    Reaction:  Unknown     Antimicrobials this admission: 1/5 Vancomycin >>  1/5 Zosyn >>   Dose adjustments this admission:   Microbiology results: 1/5 BCx: 1/5 UCx:  Thank you for allowing pharmacy to be a part of this patient's care.  Lynann Beaverhristine Alby Schwabe PharmD, BCPS Pager [phone] 08/20/2016 9:56 PM

## 2016-08-21 ENCOUNTER — Encounter (HOSPITAL_COMMUNITY): Payer: Self-pay | Admitting: Family Medicine

## 2016-08-21 DIAGNOSIS — J111 Influenza due to unidentified influenza virus with other respiratory manifestations: Secondary | ICD-10-CM | POA: Diagnosis present

## 2016-08-21 DIAGNOSIS — Z993 Dependence on wheelchair: Secondary | ICD-10-CM | POA: Diagnosis not present

## 2016-08-21 DIAGNOSIS — J189 Pneumonia, unspecified organism: Secondary | ICD-10-CM | POA: Diagnosis present

## 2016-08-21 DIAGNOSIS — Z7401 Bed confinement status: Secondary | ICD-10-CM | POA: Diagnosis not present

## 2016-08-21 DIAGNOSIS — R251 Tremor, unspecified: Secondary | ICD-10-CM | POA: Diagnosis not present

## 2016-08-21 DIAGNOSIS — R569 Unspecified convulsions: Secondary | ICD-10-CM | POA: Diagnosis present

## 2016-08-21 DIAGNOSIS — A419 Sepsis, unspecified organism: Secondary | ICD-10-CM | POA: Diagnosis present

## 2016-08-21 DIAGNOSIS — R6250 Unspecified lack of expected normal physiological development in childhood: Secondary | ICD-10-CM | POA: Diagnosis present

## 2016-08-21 DIAGNOSIS — Z888 Allergy status to other drugs, medicaments and biological substances status: Secondary | ICD-10-CM | POA: Diagnosis not present

## 2016-08-21 DIAGNOSIS — R918 Other nonspecific abnormal finding of lung field: Secondary | ICD-10-CM | POA: Diagnosis not present

## 2016-08-21 DIAGNOSIS — R69 Illness, unspecified: Secondary | ICD-10-CM

## 2016-08-21 DIAGNOSIS — Z79899 Other long term (current) drug therapy: Secondary | ICD-10-CM | POA: Diagnosis not present

## 2016-08-21 DIAGNOSIS — G801 Spastic diplegic cerebral palsy: Secondary | ICD-10-CM | POA: Diagnosis present

## 2016-08-21 DIAGNOSIS — G800 Spastic quadriplegic cerebral palsy: Secondary | ICD-10-CM | POA: Diagnosis present

## 2016-08-21 DIAGNOSIS — F79 Unspecified intellectual disabilities: Secondary | ICD-10-CM | POA: Diagnosis present

## 2016-08-21 DIAGNOSIS — E876 Hypokalemia: Secondary | ICD-10-CM | POA: Diagnosis present

## 2016-08-21 LAB — URINALYSIS, ROUTINE W REFLEX MICROSCOPIC
Bilirubin Urine: NEGATIVE
GLUCOSE, UA: NEGATIVE mg/dL
Hgb urine dipstick: NEGATIVE
KETONES UR: NEGATIVE mg/dL
LEUKOCYTES UA: NEGATIVE
Nitrite: NEGATIVE
PH: 5 (ref 5.0–8.0)
Protein, ur: NEGATIVE mg/dL
Specific Gravity, Urine: 1.03 (ref 1.005–1.030)

## 2016-08-21 LAB — BASIC METABOLIC PANEL
Anion gap: 7 (ref 5–15)
BUN: 22 mg/dL — AB (ref 6–20)
CALCIUM: 7.5 mg/dL — AB (ref 8.9–10.3)
CO2: 23 mmol/L (ref 22–32)
CREATININE: 0.45 mg/dL (ref 0.44–1.00)
Chloride: 111 mmol/L (ref 101–111)
GFR calc Af Amer: >60 mL/min (ref >60)
GLUCOSE: 100 mg/dL — AB (ref 65–99)
Potassium: 3.4 mmol/L — ABNORMAL LOW (ref 3.5–5.1)
SODIUM: 141 mmol/L (ref 135–145)

## 2016-08-21 LAB — INFLUENZA PANEL BY PCR (TYPE A & B)
INFLBPCR: NEGATIVE
Influenza A By PCR: NEGATIVE

## 2016-08-21 LAB — CBC
HCT: 30 % — ABNORMAL LOW (ref 36.0–46.0)
Hemoglobin: 10.2 g/dL — ABNORMAL LOW (ref 12.0–15.0)
MCH: 32.7 pg (ref 26.0–34.0)
MCHC: 34 g/dL (ref 30.0–36.0)
MCV: 96.2 fL (ref 78.0–100.0)
PLATELETS: 128 10*3/uL — AB (ref 150–400)
RBC: 3.12 MIL/uL — AB (ref 3.87–5.11)
RDW: 12.9 % (ref 11.5–15.5)
WBC: 4.3 10*3/uL (ref 4.0–10.5)

## 2016-08-21 LAB — I-STAT CG4 LACTIC ACID, ED: Lactic Acid, Venous: 1.13 mmol/L (ref 0.5–1.9)

## 2016-08-21 LAB — MRSA PCR SCREENING: MRSA by PCR: NEGATIVE

## 2016-08-21 MED ORDER — SODIUM CHLORIDE 0.9 % IV SOLN: 30.0000 meq | Freq: Once | INTRAVENOUS | Status: DC

## 2016-08-21 MED ORDER — ONDANSETRON HCL 4 MG/2ML IJ SOLN: 4.0000 mg | Freq: Four times a day (QID) | INTRAMUSCULAR | Status: DC | PRN

## 2016-08-21 MED ORDER — DEXTROSE 5 % IV SOLN
1.0000 g | INTRAVENOUS | Status: DC
  Administered 2016-08-21 – 2016-08-23 (×3): 1 g via INTRAVENOUS
  Filled 2016-08-21 (×3): qty 10

## 2016-08-21 MED ORDER — POTASSIUM CHLORIDE 10 MEQ/100ML IV SOLN
10.0000 meq | INTRAVENOUS | Status: AC
  Administered 2016-08-21 (×3): 10 meq via INTRAVENOUS
  Filled 2016-08-21 (×3): qty 100

## 2016-08-21 MED ORDER — CHLORHEXIDINE GLUCONATE 0.12 % MT SOLN
15.0000 mL | Freq: Two times a day (BID) | OROMUCOSAL | Status: DC
  Administered 2016-08-21 – 2016-08-23 (×4): 15 mL via OROMUCOSAL
  Filled 2016-08-21 (×4): qty 15

## 2016-08-21 MED ORDER — LORAZEPAM 1 MG PO TABS
1.0000 mg | ORAL_TABLET | Freq: Three times a day (TID) | ORAL | Status: DC
  Administered 2016-08-21 – 2016-08-23 (×7): 1 mg via ORAL
  Filled 2016-08-21 (×7): qty 1

## 2016-08-21 MED ORDER — RESOURCE THICKENUP CLEAR PO POWD
ORAL | Status: DC | PRN
  Filled 2016-08-21: qty 125

## 2016-08-21 MED ORDER — OSELTAMIVIR PHOSPHATE 6 MG/ML PO SUSR
75.0000 mg | Freq: Two times a day (BID) | ORAL | Status: DC
  Administered 2016-08-21: 75 mg via ORAL
  Filled 2016-08-21 (×2): qty 12.5

## 2016-08-21 MED ORDER — SODIUM CHLORIDE 0.9 % IV SOLN
INTRAVENOUS | Status: DC
  Administered 2016-08-21 – 2016-08-23 (×4): via INTRAVENOUS

## 2016-08-21 MED ORDER — ORAL CARE MOUTH RINSE
15.0000 mL | Freq: Two times a day (BID) | OROMUCOSAL | Status: DC
  Administered 2016-08-21 – 2016-08-23 (×5): 15 mL via OROMUCOSAL

## 2016-08-21 MED ORDER — ACETAMINOPHEN 650 MG RE SUPP: 650.0000 mg | RECTAL | Status: DC | PRN

## 2016-08-21 MED ORDER — AZITHROMYCIN 250 MG PO TABS
500.0000 mg | ORAL_TABLET | ORAL | Status: DC
  Administered 2016-08-21 – 2016-08-23 (×3): 500 mg via ORAL
  Filled 2016-08-21 (×3): qty 2

## 2016-08-21 MED ORDER — ENOXAPARIN SODIUM 30 MG/0.3ML ~~LOC~~ SOLN
30.0000 mg | Freq: Every day | SUBCUTANEOUS | Status: DC
  Administered 2016-08-21 – 2016-08-23 (×3): 30 mg via SUBCUTANEOUS
  Filled 2016-08-21 (×3): qty 0.3

## 2016-08-21 MED ORDER — ONDANSETRON HCL 4 MG PO TABS: 4.0000 mg | ORAL_TABLET | Freq: Four times a day (QID) | ORAL | Status: DC | PRN

## 2016-08-21 NOTE — Progress Notes (Signed)
Patient was admitted overnight after midnight and H and P has been reviewed and am in current agreement with Assessment and Plan. Patient is a 61 yo bedbound Caucasian female with a PMH of Spastic Quadraparesis, Cerebral Palsy with contractures and who is nonverbal at baseline along with Tremors and Hx of Seizures who presents with a fever of 101.4. She was found to be Flu negative and CXR showed Mild Left Perihilar Infiltrate. She was started on IV Ceftriaxone and Azithromycin and Admitted. Because Influenza testing was negative so Droplet precautions were removed. Patient was afebrile and had no leukocytosis today. She was found to be Hypokalemic so that was replete intravenously. Urinalysis was unremarkable and Blood and Urine Cx are pending. She is currently receiving IVF Rehydration and will decrease to 75 mL/hr. Continue to Monitor Closely and possibly D/C in AM if medically stable.

## 2016-08-21 NOTE — Progress Notes (Signed)
Rx Brief note:  Lovenox  Wt=40.4 kg, CrCl ~ 47 ml/min Rx adjusted Lovenox to 30mg  daily in pt with wt<45 kg  Thanks Lorenza EvangelistGreen, Annette Bertelson R 08/21/2016 2:33 AM

## 2016-08-21 NOTE — H&P (Signed)
History and Physical  Patient Name: [name]     OZH:[id number]    DOB: August 23, 1955    DOA: 08/20/2016 PCP: No primary care provider on file.   Patient coming from: SNF  Chief Complaint: Decreased appetite, fever  HPI: [name] is a 61 y.o. female with a past medical history significant for spastic cerebral palsy, nonverbal at baseline and tremors who presents with fever for 1 day.  Caveat that all history is collected from caregivers as the patient has developed line is nonverbal. The patient was in her usual state of health until today when staff at her facility emesis she had decreased appetite, vomited once. They checked her temperature and it was 100 F, so they brought her to the emergency room. There is no obvious new cough, there were no obvious new urinary symptoms, she has chronic tremor, this was worse today.  ED course: -Febrile to 101.65F, heart rate 108, respirations 20-27, blood pressure initially 87/56, pulse oximetry normal -Na 143, K 4.1, Cr 0.49, WBC 5.5K, Hgb 11.9 -Urinalysis clear -Lactate 1.61 -Chest x-ray showed a new left perihilar infiltrate -She was given vancomycin and Zosyn and 2 L normal saline as well as Tamiflu, and TRH was asked to evaluate for admission      ROS: Review of Systems  Unable to perform ROS: Patient nonverbal          Past Medical History:  Diagnosis Date  . Cerebral palsy (HCC)   . Encephalitis   . Mental retardation   . Seizures (HCC)   . Spastic paresis     History reviewed. No pertinent surgical history.  Social History: Patient lives at [facility].  The patient is bedbound, wheelchair bound.  Does not smoke.    Allergies  Allergen Reactions  . Pseudoephedrine Other (See Comments)    Reaction:  Unknown   . Sympathomimetics Other (See Comments)    Reaction:  Unknown     Family history: Patient unable to provide information.  Prior to Admission medications   Medication Sig Start Date End Date Taking?  Authorizing Provider  acetaminophen (TYLENOL) 325 MG tablet Take 650 mg by mouth every 4 (four) hours as needed for mild pain, moderate pain, fever or headache.   Yes Historical Provider, MD  alum & mag hydroxide-simeth (MAALOX/MYLANTA) 200-200-20 MG/5ML suspension Take 15-30 mLs by mouth every 2 (two) hours as needed for indigestion or heartburn.   Yes Historical Provider, MD  bisacodyl (DULCOLAX) 10 MG suppository Place 10 mg rectally as needed for moderate constipation.   Yes Historical Provider, MD  carbamide peroxide (DEBROX) 6.5 % otic solution Place 5 drops into both ears as needed (to remove ear wax).   Yes Historical Provider, MD  chlorpheniramine (CHLOR-TRIMETON) 4 MG tablet Take 4 mg by mouth every 8 (eight) hours as needed for allergies.   Yes Historical Provider, MD  Eyelid Cleansers (OCUSOFT EYELID CLEANSING EX) Place 1 application into both eyes every evening.   Yes Historical Provider, MD  guaifenesin (ROBITUSSIN) 100 MG/5ML syrup Take 300 mg by mouth every 4 (four) hours as needed for cough.   Yes Historical Provider, MD  hydrocortisone cream 1 % Apply 1 application topically as needed for itching (and/or bug bites).   Yes Historical Provider, MD  lip balm (CARMEX) ointment Apply 1 application topically as needed for lip care.   Yes Historical Provider, MD  loperamide (IMODIUM) 2 MG capsule Take 2-4 mg by mouth as needed for diarrhea or loose stools.  Yes Historical Provider, MD  LORazepam (ATIVAN) 1 MG tablet Take 1 mg by mouth 3 (three) times daily.   Yes Historical Provider, MD  magnesium hydroxide (MILK OF MAGNESIA) 400 MG/5ML suspension Take 15-30 mLs by mouth as needed for mild constipation.   Yes Historical Provider, MD  Miconazole Nitrate 2 % AERP Apply 1 application topically at bedtime. Pt applies to buttocks and back.   Yes Historical Provider, MD  mineral oil enema Place 1 enema rectally once as needed for severe constipation.   Yes Historical Provider, MD    neomycin-bacitracin-polymyxin (NEOSPORIN) [phone] ointment Apply 1 application topically as needed (for wound care).   Yes Historical Provider, MD  polyethylene glycol (MIRALAX / GLYCOLAX) packet Take 17 g by mouth daily.   Yes Historical Provider, MD  promethazine (PHENERGAN) 25 MG tablet Take 25 mg by mouth every 4 (four) hours as needed for nausea or vomiting.   Yes Historical Provider, MD  Skin Protectants, Misc. (MINERIN) CREA Apply 1 application topically 2 (two) times daily. Pt applies to face and arms.   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   Eyes: Anicteric, conjunctiva pink, lids and lashes normal. PERRL.    ENT: No nasal deformity,  Epistaxis.  There is dry dischrge around nares.  Hearing unable to assess. OP with dry MM, does not open mouth to command.   Neck: No neck masses.  Trachea midline.  No thyromegaly/tenderness. Lymph: No cervical or supraclavicular lymphadenopathy. Skin: Warm and dry.  No jaundice.  No suspicious rashes or lesions.  No mottling, pallor. Cardiac: RRR, nl S1-S2, no murmurs appreciated.  Capillary refill is brisk.  No LE edema.  Radial pulses 2+ and symmetric. Respiratory: Normal respiratory rate and rhythm.  Breath sounds diminished, patient does not breathe on command. Abdomen: Abdomen soft.  No grimace with palpation. No ascites, distension, hepatosplenomegaly.   MSK: Contractures in all limbs.  No cyanosis or clubbing. Neuro: Spastic quadruparesis.  Does not follow commands.  Eyes open.  Repeated symmetric jerking dyskinesias.   Psych: Unable to asssess     Labs on Admission:  I have personally reviewed following labs and imaging studies: CBC:  Recent Labs Lab 08/20/16 2138  WBC 5.5  NEUTROABS 4.5   HGB 11.9*  HCT 35.9*  MCV 95.7  PLT 178   Basic Metabolic Panel:  Recent Labs Lab 08/20/16 2138  NA 143  K 4.1  CL 109  CO2 28  GLUCOSE 120*  BUN 28*  CREATININE 0.49  CALCIUM 8.8*   GFR: Estimated Creatinine Clearance: 74.4 mL/min (by C-G formula based on SCr of 0.49 mg/dL).  Liver Function Tests:  Recent Labs Lab 08/20/16 2138  AST 16  ALT 15  ALKPHOS 74  BILITOT 0.9  PROT 6.6  ALBUMIN 3.8   No results for input(s): LIPASE, AMYLASE in the last 168 hours. No results for input(s): AMMONIA in the last 168 hours. Coagulation Profile: No results for input(s): INR, PROTIME in the last 168 hours. Cardiac Enzymes: No results for input(s): CKTOTAL, CKMB, CKMBINDEX, TROPONINI in the last 168 hours. BNP (last 3 results) No results for input(s): PROBNP in the last 8760 hours. HbA1C:  No results for input(s): HGBA1C in the last 72 hours. CBG: No results for input(s): GLUCAP in the last 168 hours. Lipid Profile: No results for input(s): CHOL, HDL, LDLCALC, TRIG, CHOLHDL, LDLDIRECT in the last 72 hours. Thyroid Function Tests: No results for input(s): TSH, T4TOTAL, FREET4, T3FREE, THYROIDAB in the last 72 hours. Anemia Panel: No results for input(s): VITAMINB12, FOLATE, FERRITIN, TIBC, IRON, RETICCTPCT in the last 72 hours. Sepsis Labs: Lactic acid 1.6 to 1.1 Invalid input(s): PROCALCITONIN, LACTICIDVEN No results found for this or any previous visit (from the past 240 hour(s)).       Radiological Exams on Admission: Personally reviewed CXR shows mild left perihilar opacity: Dg Chest Port 1 View  Result Date: 08/20/2016 CLINICAL DATA:  Intermittent fever EXAM: PORTABLE CHEST 1 VIEW COMPARISON:  12/13/2011 FINDINGS: Elevated left diaphragm. Right lung is grossly clear. Mild left perihilar infiltrate. Heart size grossly within normal limits. No obvious pneumothorax. IMPRESSION: Mild left perihilar infiltrate Electronically Signed   By: Jasmine PangKim  Fujinaga M.D.   On:  08/20/2016 22:37    Echocardiogram 2014: EF 55-60% No significant valvualr disease    Assessment/Plan Principal Problem:   Community acquired pneumonia Active Problems:   Influenza-like illness   Spastic cerebral palsy (HCC)   Developmental delay   Occasional tremors  1. Community-acquired pneumonia, left upper lobe:  -Ceftriaxone and azithromycin -Strep urinary antigen -Continue Tamiflu -Follow flu swab -Follow blood cultures     2. Tremor:  Does not appear to me to be seizures. Takes lorazepam TID at her facility for "tremors". -Continue lorazepam       DVT prophylaxis: Lovenox  Code Status: FULL  Family Communication: None present, two caregivers at bedside, all questions answered, overnight plan discussed, CODE STATUS confirmed.  Disposition Plan: Anticipate IV fluids and antibiotics overnight.  If HR improved, and influenza negative, discharge to facility tomorrow. Consults called: None Admission status: OBS At the point of initial evaluation, it is my clinical opinion that admission for OBSERVATION is reasonable and necessary because the patient's presenting complaints in the context of their chronic conditions represent sufficient risk of deterioration or significant morbidity to constitute reasonable grounds for close observation in the hospital setting, but that the patient may be medically stable for discharge from the hospital within 24 to 48 hours.    Medical decision making: Patient seen at 1:20 AM on 08/21/2016.  The patient was discussed with Antony MaduraKelly Humes, PA-C.  What exists of the patient's chart was reviewed in depth and summarized above.  Clinical condition: stable.        Alberteen SamChristopher P Danford [facility] Pager [phone]

## 2016-08-22 DIAGNOSIS — E876 Hypokalemia: Secondary | ICD-10-CM

## 2016-08-22 LAB — COMPREHENSIVE METABOLIC PANEL
ALBUMIN: 3.3 g/dL — AB (ref 3.5–5.0)
ALK PHOS: 63 U/L (ref 38–126)
ALT: 27 U/L (ref 14–54)
AST: 23 U/L (ref 15–41)
Anion gap: 7 (ref 5–15)
BILIRUBIN TOTAL: 1.1 mg/dL (ref 0.3–1.2)
BUN: 7 mg/dL (ref 6–20)
CALCIUM: 8.2 mg/dL — AB (ref 8.9–10.3)
CO2: 24 mmol/L (ref 22–32)
CREATININE: 0.43 mg/dL — AB (ref 0.44–1.00)
Chloride: 107 mmol/L (ref 101–111)
GFR calc Af Amer: >60 mL/min (ref >60)
GFR calc non Af Amer: >60 mL/min (ref >60)
GLUCOSE: 88 mg/dL (ref 65–99)
Potassium: 3.8 mmol/L (ref 3.5–5.1)
Sodium: 138 mmol/L (ref 135–145)
TOTAL PROTEIN: 5.5 g/dL — AB (ref 6.5–8.1)

## 2016-08-22 LAB — CBC WITH DIFFERENTIAL/PLATELET
BASOS ABS: 0 10*3/uL (ref 0.0–0.1)
BASOS PCT: 0 %
Eosinophils Absolute: 0.2 10*3/uL (ref 0.0–0.7)
Eosinophils Relative: 4 %
HEMATOCRIT: 32.5 % — AB (ref 36.0–46.0)
HEMOGLOBIN: 10.7 g/dL — AB (ref 12.0–15.0)
Lymphocytes Relative: 40 %
Lymphs Abs: 1.9 10*3/uL (ref 0.7–4.0)
MCH: 30.7 pg (ref 26.0–34.0)
MCHC: 32.9 g/dL (ref 30.0–36.0)
MCV: 93.4 fL (ref 78.0–100.0)
Monocytes Absolute: 0.6 10*3/uL (ref 0.1–1.0)
Monocytes Relative: 13 %
NEUTROS ABS: 2.1 10*3/uL (ref 1.7–7.7)
NEUTROS PCT: 43 %
Platelets: 132 10*3/uL — ABNORMAL LOW (ref 150–400)
RBC: 3.48 MIL/uL — AB (ref 3.87–5.11)
RDW: 12.4 % (ref 11.5–15.5)
WBC: 4.7 10*3/uL (ref 4.0–10.5)

## 2016-08-22 LAB — MAGNESIUM: Magnesium: 1.6 mg/dL — ABNORMAL LOW (ref 1.7–2.4)

## 2016-08-22 LAB — URINE CULTURE: Culture: NO GROWTH

## 2016-08-22 LAB — PHOSPHORUS: Phosphorus: 3.1 mg/dL (ref 2.5–4.6)

## 2016-08-22 MED ORDER — MAGNESIUM SULFATE 2 GM/50ML IV SOLN
2.0000 g | Freq: Once | INTRAVENOUS | Status: AC
  Administered 2016-08-22: 2 g via INTRAVENOUS
  Filled 2016-08-22: qty 50

## 2016-08-22 NOTE — Progress Notes (Signed)
PROGRESS NOTE    [name]  WUJ:[id number] DOB: December 06, 1955 DOA: 08/20/2016 PCP: No primary care provider on file.   Brief Narrative:  [name] is a 61 y.o. female with a past medical history significant for spastic cerebral palsy, nonverbal at baseline and tremors who presents with fever for 1 day.  Caveat that all history is collected from caregivers as the patient has developed line is nonverbal. The patient was in her usual state of health until today when staff at her facility emesis she had decreased appetite, vomited once. They checked her temperature and it was 100 F, so they brought her to the emergency room. There is no obvious new cough, there were no obvious new urinary symptoms, she has chronic tremor, which was worse on admission. Tremor has improved and patient looks improved clinically.   Assessment & Plan:   Principal Problem:   Community acquired pneumonia Active Problems:   Influenza-like illness   Spastic cerebral palsy (HCC)   Developmental delay   Occasional tremors   Pulmonary infiltrate   Sepsis (HCC)  1. Community-Acquired Pneumonia, left upper lobe:  -CXR showed Elevated left diaphragm. Right lung is grossly clear. Mild left perihilar infiltrate. Heart size grossly within normal limits. No obvious pneumothorax. -C/w IV Ceftriaxone 1 gram q24h and po Azithromycin 500 mg po q24h -Influenza A/B was Negative by PCR; Discontinued Droplet Precautions and Discontinued Oseltamivir  -Strep Urine Ag Pending -Blood Cx x2 showed NGTD at 1 day -C/w Oral Care with Chlorhexidine 15 mL Mouth Rinse BID and with Medline Mouth Rinse BID -Zofran for N/V  2. Tremor:  -Improved -Does not appear to me to be seizures. Takes lorazepam TID at her facility for "tremors". -Continue Lorazepam 1 mg po TID  3. Hypokalemia -Improved. -Patient's K+ went from 3.4 -> 3.8 this AM after repletion yesterday -Repeat CMP in AM  4. Elevated BUN -Improved from 22 -> 0.43 after  IVF Rehydration -C/w IVF Rehydration of 75 mL/hr   5. Hypomagnesemia -Patient's Mag level was 1.6 -Replete with IV Mag Sulfate -Repeat Mag Level in AM  6. 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 Continue To Monitor -Patient Essentially bed and wheelchair bound  7. Hx of Seizures -Does not appear to be having Seizures -Continue to Monitor; Outpatient Neurology Follow Up  DVT prophylaxis: Lovenox 30 mg sq Code Status: FULL CODE Family Communication: No family present at bedside Disposition Plan: Back to Group Home in AM likely  Consultants:   None   Procedures: None   Antimicrobials: po Azithromycin and IV Ceftriaxone 08/21/16 -->  Subjective: Seen and examined at bedside and unable to provide a subjective history as patient is essentially non-verbal. No family or caregivers at bedside. Did not look like she was in acute distress.   Objective: Vitals:   08/21/16 1335 08/21/16 2049 08/22/16 0526 08/22/16 1338  BP: (!) 129/92 120/74 100/81 123/80  Pulse: 73 65 70 (!) 54  Resp: 20 20 20 20   Temp: 98.2 F (36.8 C) 98.4 F (36.9 C) 98.6 F (37 C) 98.2 F (36.8 C)  TempSrc: Axillary Axillary Axillary Axillary  SpO2: 95% 96% 97% 97%  Weight:      Height:        Intake/Output Summary (Last 24 hours) at 08/22/16 1618 Last data filed at 08/21/16 1708  Gross per 24 hour  Intake             1155 ml  Output  0 ml  Net             1155 ml   Filed Weights   08/20/16 2101 08/21/16 0220  Weight: 80.9 kg (178 lb 4.8 oz) 40.4 kg (89 lb 1.1 oz)    Examination: Physical Exam:  Constitutional: Thin spastic quadraparesis with contractures; awake but nonverbal  Eyes: Lids and conjunctivae normal, sclerae anicteric  ENMT: External Ears, Nose appear normal.  Neck: Appears normal, supple, no cervical masses, normal ROM, no appreciable thyromegaly, no visible JVD Respiratory: Diminished but no no wheezing, rales, rhonchi or crackles.  Normal respiratory effort and patient is not tachypenic. No accessory muscle use.  Cardiovascular: RRR, no murmurs / rubs / gallops. S1 and S2 auscultated. No extremity edema. Abdomen: Soft, non-tender, non-distended. No masses palpated. No appreciable hepatosplenomegaly. Bowel sounds positive.  GU: Deferred. Musculoskeletal: No clubbing / cyanosis of digits/nails. Severely contractured in Upper and Lower Extremities.  Skin: No rashes, lesions, ulcers. No induration; Warm and dry.  Neurologic: Not able to follow commands because of current condition. Romberg sign cerebellar reflexes not assessed.  Psychiatric: Unable to assess due to current condition.   Data Reviewed: I have personally reviewed following labs and imaging studies  CBC:  Recent Labs Lab 08/20/16 2138 08/21/16 0449 08/22/16 0427  WBC 5.5 4.3 4.7  NEUTROABS 4.5  --  2.1  HGB 11.9* 10.2* 10.7*  HCT 35.9* 30.0* 32.5*  MCV 95.7 96.2 93.4  PLT 178 128* 132*   Basic Metabolic Panel:  Recent Labs Lab 08/20/16 2138 08/21/16 0449 08/22/16 0427  NA 143 141 138  K 4.1 3.4* 3.8  CL 109 111 107  CO2 28 23 24   GLUCOSE 120* 100* 88  BUN 28* 22* 7  CREATININE 0.49 0.45 0.43*  CALCIUM 8.8* 7.5* 8.2*  MG  --   --  1.6*  PHOS  --   --  3.1   GFR: Estimated Creatinine Clearance: 47.1 mL/min (by C-G formula based on SCr of 0.43 mg/dL (L)). Liver Function Tests:  Recent Labs Lab 08/20/16 2138 08/22/16 0427  AST 16 23  ALT 15 27  ALKPHOS 74 63  BILITOT 0.9 1.1  PROT 6.6 5.5*  ALBUMIN 3.8 3.3*   No results for input(s): LIPASE, AMYLASE in the last 168 hours. No results for input(s): AMMONIA in the last 168 hours. Coagulation Profile: No results for input(s): INR, PROTIME in the last 168 hours. Cardiac Enzymes: No results for input(s): CKTOTAL, CKMB, CKMBINDEX, TROPONINI in the last 168 hours. BNP (last 3 results) No results for input(s): PROBNP in the last 8760 hours. HbA1C: No results for input(s): HGBA1C in  the last 72 hours. CBG: No results for input(s): GLUCAP in the last 168 hours. Lipid Profile: No results for input(s): CHOL, HDL, LDLCALC, TRIG, CHOLHDL, LDLDIRECT in the last 72 hours. Thyroid Function Tests: No results for input(s): TSH, T4TOTAL, FREET4, T3FREE, THYROIDAB in the last 72 hours. Anemia Panel: No results for input(s): VITAMINB12, FOLATE, FERRITIN, TIBC, IRON, RETICCTPCT in the last 72 hours. Sepsis Labs:  Recent Labs Lab 08/20/16 2152 08/21/16 0010  LATICACIDVEN 1.61 1.13    Recent Results (from the past 240 hour(s))  Urine culture     Status: None   Collection Time: 08/20/16  9:00 PM  Result Value Ref Range Status   Specimen Description URINE, CLEAN CATCH  Final   Special Requests NONE  Final   Culture NO GROWTH Performed at [facility]   Final   Report Status 08/22/2016 FINAL  Final  Culture, blood (Routine x 2)     Status: None (Preliminary result)   Collection Time: 08/20/16  9:38 PM  Result Value Ref Range Status   Specimen Description BLOOD RIGHT ANTECUBITAL  Final   Special Requests BOTTLES DRAWN AEROBIC AND ANAEROBIC 5 CC  Final   Culture   Final    NO GROWTH 1 DAY [facility]    Report Status PENDING  Incomplete  Culture, blood (Routine x 2)     Status: None (Preliminary result)   Collection Time: 08/20/16 10:07 PM  Result Value Ref Range Status   Specimen Description BLOOD RIGHT HAND  Final   Special Requests BOTTLES DRAWN AEROBIC AND ANAEROBIC 5CC  Final   Culture   Final    NO GROWTH 1 DAY [facility]    Report Status PENDING  Incomplete  MRSA PCR Screening     Status: None   Collection Time: 08/21/16  2:07 AM  Result Value Ref Range Status   MRSA by PCR NEGATIVE NEGATIVE Final    Comment:        The GeneXpert MRSA Assay (FDA approved for NASAL specimens only), is one component of a comprehensive MRSA colonization surveillance program. It is not intended to diagnose MRSA infection  nor to guide or monitor treatment for MRSA infections.     Radiology Studies: Dg Chest Port 1 View  Result Date: 08/20/2016 CLINICAL DATA:  Intermittent fever EXAM: PORTABLE CHEST 1 VIEW COMPARISON:  12/13/2011 FINDINGS: Elevated left diaphragm. Right lung is grossly clear. Mild left perihilar infiltrate. Heart size grossly within normal limits. No obvious pneumothorax. IMPRESSION: Mild left perihilar infiltrate Electronically Signed   By: Jasmine PangKim  Fujinaga M.D.   On: 08/20/2016 22:37   Scheduled Meds: . azithromycin  500 mg Oral Q24H  . cefTRIAXone (ROCEPHIN)  IV  1 g Intravenous Q24H  . chlorhexidine  15 mL Mouth Rinse BID  . enoxaparin (LOVENOX) injection  30 mg Subcutaneous Daily  . LORazepam  1 mg Oral TID  . mouth rinse  15 mL Mouth Rinse q12n4p   Continuous Infusions: . sodium chloride 75 mL/hr at 08/22/16 0329     LOS: 1 day   Merlene Laughtermair Latif Naliya Gish, DO [facility] Pager [phone]  If 7PM-7AM, please contact night-coverage [email or web address] Password [email or web address] 08/22/2016, 4:18 PM

## 2016-08-22 NOTE — Progress Notes (Signed)
Patient is nonverbal. Resting quietly with eyes opened. No acute distress noted. Will continue to monitor and assessments pt needs/concerns and safety.

## 2016-08-23 DIAGNOSIS — Z87898 Personal history of other specified conditions: Secondary | ICD-10-CM

## 2016-08-23 LAB — CBC WITH DIFFERENTIAL/PLATELET
BASOS ABS: 0 10*3/uL (ref 0.0–0.1)
Basophils Relative: 0 %
Eosinophils Absolute: 0.1 10*3/uL (ref 0.0–0.7)
Eosinophils Relative: 3 %
HEMATOCRIT: 33.5 % — AB (ref 36.0–46.0)
HEMOGLOBIN: 11.5 g/dL — AB (ref 12.0–15.0)
LYMPHS ABS: 1.4 10*3/uL (ref 0.7–4.0)
LYMPHS PCT: 34 %
MCH: 32 pg (ref 26.0–34.0)
MCHC: 34.3 g/dL (ref 30.0–36.0)
MCV: 93.3 fL (ref 78.0–100.0)
Monocytes Absolute: 0.5 10*3/uL (ref 0.1–1.0)
Monocytes Relative: 11 %
NEUTROS ABS: 2.2 10*3/uL (ref 1.7–7.7)
NEUTROS PCT: 52 %
PLATELETS: 141 10*3/uL — AB (ref 150–400)
RBC: 3.59 MIL/uL — AB (ref 3.87–5.11)
RDW: 12.2 % (ref 11.5–15.5)
WBC: 4.2 10*3/uL (ref 4.0–10.5)

## 2016-08-23 LAB — COMPREHENSIVE METABOLIC PANEL
ALK PHOS: 66 U/L (ref 38–126)
ALT: 24 U/L (ref 14–54)
AST: 22 U/L (ref 15–41)
Albumin: 3.4 g/dL — ABNORMAL LOW (ref 3.5–5.0)
Anion gap: 11 (ref 5–15)
BILIRUBIN TOTAL: 1.5 mg/dL — AB (ref 0.3–1.2)
BUN: 12 mg/dL (ref 6–20)
CHLORIDE: 107 mmol/L (ref 101–111)
CO2: 22 mmol/L (ref 22–32)
CREATININE: 0.44 mg/dL (ref 0.44–1.00)
Calcium: 8.1 mg/dL — ABNORMAL LOW (ref 8.9–10.3)
GFR calc Af Amer: >60 mL/min (ref >60)
Glucose, Bld: 74 mg/dL (ref 65–99)
Potassium: 3.6 mmol/L (ref 3.5–5.1)
Sodium: 140 mmol/L (ref 135–145)
Total Protein: 5.6 g/dL — ABNORMAL LOW (ref 6.5–8.1)

## 2016-08-23 LAB — PHOSPHORUS: Phosphorus: 4 mg/dL (ref 2.5–4.6)

## 2016-08-23 LAB — MAGNESIUM: MAGNESIUM: 2.2 mg/dL (ref 1.7–2.4)

## 2016-08-23 LAB — STREP PNEUMONIAE URINARY ANTIGEN: Strep Pneumo Urinary Antigen: NEGATIVE

## 2016-08-23 MED ORDER — ORAL CARE MOUTH RINSE: 15.0000 mL | Freq: Two times a day (BID) | OROMUCOSAL | 0 refills | Status: DC

## 2016-08-23 MED ORDER — RESOURCE THICKENUP CLEAR PO POWD: 1.0000 | ORAL | Status: DC | PRN

## 2016-08-23 MED ORDER — LEVOFLOXACIN 750 MG PO TABS: 750.0000 mg | ORAL_TABLET | Freq: Every day | ORAL | 0 refills | Status: DC

## 2016-08-23 MED ORDER — LORAZEPAM 1 MG PO TABS: 1.0000 mg | ORAL_TABLET | Freq: Three times a day (TID) | ORAL | 0 refills | Status: AC

## 2016-08-23 MED ORDER — CHLORHEXIDINE GLUCONATE 0.12 % MT SOLN: 15.0000 mL | Freq: Two times a day (BID) | OROMUCOSAL | 0 refills | Status: DC

## 2016-08-23 NOTE — Progress Notes (Signed)
[phone] from [facility] group home is stable for d/c today. D/C Summary sent to nsg at group home to review. Scripts included in d/c packet. # for report provided to nsg. Group home staff will transport. Pt guardian Zebedee IbaRon Johnson contacted [phone] and updated.   Cori RazorJamie Kyrin Garn LCSW [phone]

## 2016-08-23 NOTE — Progress Notes (Signed)
In and out cath done per MD. 2 assist needed, patient contracted,had a hard time performing In and out catheter. Obtained approximately 60cc of yellow urine.Sent to lab by NT.

## 2016-08-23 NOTE — Discharge Summary (Signed)
Physician Discharge Summary  [name]:[id number] DOB: 19-Mar-1956 DOA: 08/20/2016  PCP: No primary care provider on file.  Admit date: 08/20/2016 Discharge date: 08/23/2016  Admitted From: Group Home Disposition: [facility]  Recommendations for Outpatient Follow-up:  1. Follow up with PCP in 1-2 weeks 2. Follow up with Neurology as an outpatient for History of Seizures/Tremors 3. Have SLP follow at Group Home 4. Repeat CXR in 3-4 weeks to ensure resolution of Pneumonia 5. Please obtain BMP/CBC in one week 6. Please follow up on the following pending results: None  Home Health: No Equipment/Devices: None  Discharge Condition: Stable CODE STATUS: FULL CODE Diet recommendation: Heart Healthy Dysphagia   Brief/Interim Summary: [name] a 61 y.o.femalewith a past medical history significant for spastic cerebral palsy, nonverbal at baseline and tremorswho presents with fever for 1 day. On Admission all history was collected from caregivers as the patient has at baseline is nonverbal. She is a 61 yo bedbound Caucasian female with a PMH of Spastic Quadraparesis, Cerebral Palsy with contractures and who is nonverbal at baseline along with Tremors and Hx of Seizures who presents with a fever of 101.4. The patient was in her usual state of health until today when staff at her facility emesis she had decreased appetite, vomited once. They checked her temperature and it was 100 F, so they brought her to the emergency room. There is no obvious new cough, there were no obvious new urinary symptoms, she haschronic tremor, which was worse on admission. Tremor has improved and patient looks improved clinically. Admitted for Left Perihilar Infiltrate and started on IV Abx. She steadily improved and was transitioned to Levaquin po and will need to follow up with her PCP within 1 week and follow up with Neurology as an outpatient. At this time she is stable to be D/C'd back to her group  home.   Discharge Diagnoses:  Principal Problem:   Community acquired pneumonia Active Problems:   Influenza-like illness   Spastic cerebral palsy (HCC)   Developmental delay   Occasional tremors   Pulmonary infiltrate   Sepsis (HCC)  . Community-Acquired Pneumonia, left upper lobe: -CXR showed Elevated left diaphragm. Right lung is grossly clear. Mild left perihilar infiltrate. Heart size grossly within normal limits. No obvious pneumothorax. -IV Ceftriaxone 1 gram q24h and po Azithromycin 500 mg po q24h changed to po Levofloxacin for 7 days.  -Influenza A/B was Negative by PCR; Discontinued Droplet Precautions and Discontinued Oseltamivir  -Strep Urine Ag Negative -Blood Cx x2 showed NGTD at 1 day -C/w Oral Care with Chlorhexidine 15 mL Mouth Rinse BID and with Medline Mouth Rinse BID -C/w Home Promethazine for N/V   2. Tremor: -Improved -Does not appear to me to be seizures. Takes lorazepam TID at her facility for "tremors". -Continue Lorazepam 1 mg po TID and follow up with Neurology as an outpatient.   3. Hypokalemia -Improved. Patient's K+ was 3.6 -Repeat CMP as an Outpatient   4. Elevated BUN -Improved from 22 -> 7 -> 12 after IVF Rehydration -Gave IVF Rehydration of 75 mL/hr which is now D/Cd -Repeat CMP as an outpatient   5. Hypomagnesemia, improved -Patient's Mag level was 1.6 -> 2.2 -Replete with IV Mag Sulfate yesterday -Follow Magnesium as an Outpatient  6. 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 Continue To Monitor -Patient Essentially bed and wheelchair bound  7. Hx of Seizures -Does not appear to be having Seizures -Continue to Monitor; Outpatient Neurology Follow Up  Discharge Instructions  Discharge Instructions    Call MD for:  difficulty breathing, headache or visual disturbances    Complete by:  As directed    Call MD for:  persistant dizziness or light-headedness    Complete by:  As directed    Call MD  for:  persistant nausea and vomiting    Complete by:  As directed    Call MD for:  severe uncontrolled pain    Complete by:  As directed    Call MD for:  temperature >100.4    Complete by:  As directed    Diet - low sodium heart healthy    Complete by:  As directed    Discharge instructions    Complete by:  As directed    Follow up at PCP. Take All Medications as prescribed. If symptoms change or worsen. Please return to the ED for Evaluation.   Increase activity slowly    Complete by:  As directed      Allergies as of 08/23/2016      Reactions   Pseudoephedrine Other (See Comments)   Reaction:  Unknown    Sympathomimetics Other (See Comments)   Reaction:  Unknown       Medication List    TAKE these medications   acetaminophen 325 MG tablet Commonly known as:  TYLENOL Take 650 mg by mouth every 4 (four) hours as needed for mild pain, moderate pain, fever or headache.   alum & mag hydroxide-simeth 200-200-20 MG/5ML suspension Commonly known as:  MAALOX/MYLANTA Take 15-30 mLs by mouth every 2 (two) hours as needed for indigestion or heartburn.   bisacodyl 10 MG suppository Commonly known as:  DULCOLAX Place 10 mg rectally as needed for moderate constipation.   carbamide peroxide 6.5 % otic solution Commonly known as:  DEBROX Place 5 drops into both ears as needed (to remove ear wax).   chlorhexidine 0.12 % solution Commonly known as:  PERIDEX 15 mLs by Mouth Rinse route 2 (two) times daily.   chlorpheniramine 4 MG tablet Commonly known as:  CHLOR-TRIMETON Take 4 mg by mouth every 8 (eight) hours as needed for allergies.   guaifenesin 100 MG/5ML syrup Commonly known as:  ROBITUSSIN Take 300 mg by mouth every 4 (four) hours as needed for cough.   hydrocortisone cream 1 % Apply 1 application topically as needed for itching (and/or bug bites).   levofloxacin 750 MG tablet Commonly known as:  LEVAQUIN Take 1 tablet (750 mg total) by mouth daily.   lip balm  ointment Apply 1 application topically as needed for lip care.   loperamide 2 MG capsule Commonly known as:  IMODIUM Take 2-4 mg by mouth as needed for diarrhea or loose stools.   LORazepam 1 MG tablet Commonly known as:  ATIVAN Take 1 tablet (1 mg total) by mouth 3 (three) times daily.   magnesium hydroxide 400 MG/5ML suspension Commonly known as:  MILK OF MAGNESIA Take 15-30 mLs by mouth as needed for mild constipation.   Miconazole Nitrate 2 % Aerp Apply 1 application topically at bedtime. Pt applies to buttocks and back.   mineral oil enema Place 1 enema rectally once as needed for severe constipation.   MINERIN Crea Apply 1 application topically 2 (two) times daily. Pt applies to face and arms.   mouth rinse Liqd solution 15 mLs by Mouth Rinse route 2 times daily at 12 noon and 4 pm.   neomycin-bacitracin-polymyxin [phone] ointment Apply 1 application topically as needed (for wound  care).   OCUSOFT EYELID CLEANSING EX Place 1 application into both eyes every evening.   polyethylene glycol packet Commonly known as:  MIRALAX / GLYCOLAX Take 17 g by mouth daily.   promethazine 25 MG tablet Commonly known as:  PHENERGAN Take 25 mg by mouth every 4 (four) hours as needed for nausea or vomiting.   RESOURCE THICKENUP CLEAR Powd Take 120 g by mouth as needed (on honey  thick liquid diet).       Allergies  Allergen Reactions  . Pseudoephedrine Other (See Comments)    Reaction:  Unknown   . Sympathomimetics Other (See Comments)    Reaction:  Unknown    Consultations:  None  Procedures/Studies: Dg Chest Port 1 View  Result Date: 08/20/2016 CLINICAL DATA:  Intermittent fever EXAM: PORTABLE CHEST 1 VIEW COMPARISON:  12/13/2011 FINDINGS: Elevated left diaphragm. Right lung is grossly clear. Mild left perihilar infiltrate. Heart size grossly within normal limits. No obvious pneumothorax. IMPRESSION: Mild left perihilar infiltrate Electronically Signed   By: Jasmine PangKim   Fujinaga M.D.   On: 08/20/2016 22:37    Subjective: Seen and examined and was non-verbal and unable to provide subjective history. No N/V. Sounds better on examination. Severe contractures.   Discharge Exam: Vitals:   08/22/16 2048 08/23/16 0504  BP: 130/72 116/90  Pulse: 94 64  Resp: 18 18  Temp: 98.4 F (36.9 C) 98.4 F (36.9 C)   Vitals:   08/22/16 0526 08/22/16 1338 08/22/16 2048 08/23/16 0504  BP: 100/81 123/80 130/72 116/90  Pulse: 70 (!) 54 94 64  Resp: 20 20 18 18   Temp: 98.6 F (37 C) 98.2 F (36.8 C) 98.4 F (36.9 C) 98.4 F (36.9 C)  TempSrc: Axillary Axillary Axillary Axillary  SpO2: 97% 97% 95% 100%  Weight:      Height:       General: Pt is awake, not appearing to be in acute distress Cardiovascular: RRR, S1/S2 +, no rubs, no gallops Respiratory: CTA bilaterally, no wheezing, no rhonchi Abdominal: Soft, NT, ND, bowel sounds + Extremities: no edema, no cyanosis severe contractures  The results of significant diagnostics from this hospitalization (including imaging, microbiology, ancillary and laboratory) are listed below for reference.    Microbiology: Recent Results (from the past 240 hour(s))  Urine culture     Status: None   Collection Time: 08/20/16  9:00 PM  Result Value Ref Range Status   Specimen Description URINE, CLEAN CATCH  Final   Special Requests NONE  Final   Culture NO GROWTH [facility]   Final   Report Status 08/22/2016 FINAL  Final  Culture, blood (Routine x 2)     Status: None (Preliminary result)   Collection Time: 08/20/16  9:38 PM  Result Value Ref Range Status   Specimen Description BLOOD RIGHT ANTECUBITAL  Final   Special Requests BOTTLES DRAWN AEROBIC AND ANAEROBIC 5 CC  Final   Culture   Final    NO GROWTH 1 DAY Performed at [facility]    Report Status PENDING  Incomplete  Culture, blood (Routine x 2)     Status: None (Preliminary result)   Collection Time: 08/20/16 10:07 PM  Result Value  Ref Range Status   Specimen Description BLOOD RIGHT HAND  Final   Special Requests BOTTLES DRAWN AEROBIC AND ANAEROBIC 5CC  Final   Culture   Final    NO GROWTH 1 DAY [facility]    Report Status PENDING  Incomplete  MRSA PCR Screening     Status: None   Collection Time: 08/21/16  2:07 AM  Result Value Ref Range Status   MRSA by PCR NEGATIVE NEGATIVE Final    Comment:        The GeneXpert MRSA Assay (FDA approved for NASAL specimens only), is one component of a comprehensive MRSA colonization surveillance program. It is not intended to diagnose MRSA infection nor to guide or monitor treatment for MRSA infections.    Labs: BNP (last 3 results) No results for input(s): BNP in the last 8760 hours. Basic Metabolic Panel:  Recent Labs Lab 08/20/16 2138 08/21/16 0449 08/22/16 0427 08/23/16 0430  NA 143 141 138 140  K 4.1 3.4* 3.8 3.6  CL 109 111 107 107  CO2 28 23 24 22   GLUCOSE 120* 100* 88 74  BUN 28* 22* 7 12  CREATININE 0.49 0.45 0.43* 0.44  CALCIUM 8.8* 7.5* 8.2* 8.1*  MG  --   --  1.6* 2.2  PHOS  --   --  3.1 4.0   Liver Function Tests:  Recent Labs Lab 08/20/16 2138 08/22/16 0427 08/23/16 0430  AST 16 23 22   ALT 15 27 24   ALKPHOS 74 63 66  BILITOT 0.9 1.1 1.5*  PROT 6.6 5.5* 5.6*  ALBUMIN 3.8 3.3* 3.4*   No results for input(s): LIPASE, AMYLASE in the last 168 hours. No results for input(s): AMMONIA in the last 168 hours. CBC:  Recent Labs Lab 08/20/16 2138 08/21/16 0449 08/22/16 0427 08/23/16 0430  WBC 5.5 4.3 4.7 4.2  NEUTROABS 4.5  --  2.1 2.2  HGB 11.9* 10.2* 10.7* 11.5*  HCT 35.9* 30.0* 32.5* 33.5*  MCV 95.7 96.2 93.4 93.3  PLT 178 128* 132* 141*   Cardiac Enzymes: No results for input(s): CKTOTAL, CKMB, CKMBINDEX, TROPONINI in the last 168 hours. BNP: Invalid input(s): POCBNP CBG: No results for input(s): GLUCAP in the last 168 hours. D-Dimer No results for input(s): DDIMER in the last 72 hours. Hgb  A1c No results for input(s): HGBA1C in the last 72 hours. Lipid Profile No results for input(s): CHOL, HDL, LDLCALC, TRIG, CHOLHDL, LDLDIRECT in the last 72 hours. Thyroid function studies No results for input(s): TSH, T4TOTAL, T3FREE, THYROIDAB in the last 72 hours.  Invalid input(s): FREET3 Anemia work up No results for input(s): VITAMINB12, FOLATE, FERRITIN, TIBC, IRON, RETICCTPCT in the last 72 hours. Urinalysis    Component Value Date/Time   COLORURINE YELLOW 08/20/2016 2100   APPEARANCEUR CLEAR 08/20/2016 2100   LABSPEC 1.030 08/20/2016 2100   PHURINE 5.0 08/20/2016 2100   GLUCOSEU NEGATIVE 08/20/2016 2100   HGBUR NEGATIVE 08/20/2016 2100   BILIRUBINUR NEGATIVE 08/20/2016 2100   KETONESUR NEGATIVE 08/20/2016 2100   PROTEINUR NEGATIVE 08/20/2016 2100   UROBILINOGEN 0.2 12/13/2011 2253   NITRITE NEGATIVE 08/20/2016 2100   LEUKOCYTESUR NEGATIVE 08/20/2016 2100   Sepsis Labs Invalid input(s): PROCALCITONIN,  WBC,  LACTICIDVEN Microbiology Recent Results (from the past 240 hour(s))  Urine culture     Status: None   Collection Time: 08/20/16  9:00 PM  Result Value Ref Range Status   Specimen Description URINE, CLEAN CATCH  Final   Special Requests NONE  Final   Culture NO GROWTH Performed at [facility]   Final   Report Status 08/22/2016 FINAL  Final  Culture, blood (Routine x 2)     Status: None (Preliminary result)   Collection Time: 08/20/16  9:38 PM  Result Value Ref Range Status   Specimen Description  BLOOD RIGHT ANTECUBITAL  Final   Special Requests BOTTLES DRAWN AEROBIC AND ANAEROBIC 5 CC  Final   Culture   Final    NO GROWTH 1 DAY Performed at [facility]    Report Status PENDING  Incomplete  Culture, blood (Routine x 2)     Status: None (Preliminary result)   Collection Time: 08/20/16 10:07 PM  Result Value Ref Range Status   Specimen Description BLOOD RIGHT HAND  Final   Special Requests BOTTLES DRAWN AEROBIC AND ANAEROBIC 5CC  Final    Culture   Final    NO GROWTH 1 DAY Performed at [facility]    Report Status PENDING  Incomplete  MRSA PCR Screening     Status: None   Collection Time: 08/21/16  2:07 AM  Result Value Ref Range Status   MRSA by PCR NEGATIVE NEGATIVE Final    Comment:        The GeneXpert MRSA Assay (FDA approved for NASAL specimens only), is one component of a comprehensive MRSA colonization surveillance program. It is not intended to diagnose MRSA infection nor to guide or monitor treatment for MRSA infections.    Time coordinating discharge: Over 30 minutes  SIGNED:  Merlene Laughter, DO [facility] 08/23/2016, 2:17 PM Pager [phone]  If 7PM-7AM, please contact night-coverage [email or web address] Password [email or web address]

## 2016-08-23 NOTE — Progress Notes (Signed)
Patient was picked up [facility]. Patient comfortable and stable on d/c.

## 2016-08-23 NOTE — Progress Notes (Signed)
Report given to Kennon Portelaheresa Johnson,[facility]. All questions answered appropriately.

## 2016-08-26 LAB — CULTURE, BLOOD (ROUTINE X 2)
CULTURE: NO GROWTH
CULTURE: NO GROWTH

## 2016-11-10 ENCOUNTER — Emergency Department (HOSPITAL_COMMUNITY)
Admission: EM | Admit: 2016-11-10 | Discharge: 2016-11-11 | Disposition: A | Discharge status: Home / Self Care | Payer: Medicaid Other | Attending: Emergency Medicine | Admitting: Emergency Medicine

## 2016-11-10 ENCOUNTER — Encounter (HOSPITAL_COMMUNITY): Payer: Self-pay | Admitting: Emergency Medicine

## 2016-11-10 ENCOUNTER — Emergency Department (HOSPITAL_COMMUNITY): Payer: Medicaid Other

## 2016-11-10 DIAGNOSIS — J069 Acute upper respiratory infection, unspecified: Secondary | ICD-10-CM | POA: Diagnosis not present

## 2016-11-10 DIAGNOSIS — R509 Fever, unspecified: Secondary | ICD-10-CM | POA: Diagnosis present

## 2016-11-10 DIAGNOSIS — Z79899 Other long term (current) drug therapy: Secondary | ICD-10-CM | POA: Diagnosis not present

## 2016-11-10 DIAGNOSIS — B9789 Other viral agents as the cause of diseases classified elsewhere: Secondary | ICD-10-CM

## 2016-11-10 LAB — CBC WITH DIFFERENTIAL/PLATELET
Basophils Absolute: 0 10*3/uL (ref 0.0–0.1)
Basophils Relative: 0 %
EOS PCT: 3 %
Eosinophils Absolute: 0.2 10*3/uL (ref 0.0–0.7)
HEMATOCRIT: 36.4 % (ref 36.0–46.0)
Hemoglobin: 12.4 g/dL (ref 12.0–15.0)
LYMPHS ABS: 1.3 10*3/uL (ref 0.7–4.0)
LYMPHS PCT: 24 %
MCH: 31.2 pg (ref 26.0–34.0)
MCHC: 34.1 g/dL (ref 30.0–36.0)
MCV: 91.7 fL (ref 78.0–100.0)
MONO ABS: 0.6 10*3/uL (ref 0.1–1.0)
Monocytes Relative: 10 %
NEUTROS ABS: 3.4 10*3/uL (ref 1.7–7.7)
Neutrophils Relative %: 63 %
PLATELETS: 136 10*3/uL — AB (ref 150–400)
RBC: 3.97 MIL/uL (ref 3.87–5.11)
RDW: 12.4 % (ref 11.5–15.5)
WBC: 5.4 10*3/uL (ref 4.0–10.5)

## 2016-11-10 MED ORDER — SODIUM CHLORIDE 0.9 % IV BOLUS (SEPSIS)
1000.0000 mL | Freq: Once | INTRAVENOUS | Status: AC
  Administered 2016-11-10: 1000 mL via INTRAVENOUS

## 2016-11-10 MED ORDER — ACETAMINOPHEN 500 MG PO TABS: 1000.0000 mg | ORAL_TABLET | Freq: Once | ORAL | Status: DC

## 2016-11-10 MED ORDER — ACETAMINOPHEN 325 MG PO TABS
650.0000 mg | ORAL_TABLET | Freq: Once | ORAL | Status: AC | PRN
Start: 2016-11-10 — End: 2016-11-10
  Administered 2016-11-10: 650 mg via ORAL
  Filled 2016-11-10: qty 2

## 2016-11-10 NOTE — ED Triage Notes (Addendum)
Pt from home with a caregiver with complaints of fever and cough that all began today. Pt's high fever at home was 100.7. Pt was only given Robitussin. Pt was not given any tylenol or motrin.Per caregiver pt had a nonproductive cough.   Pt was hospitalized 2 months ago for pneumonia.

## 2016-11-10 NOTE — ED Notes (Signed)
Patient transported to X-ray 

## 2016-11-11 LAB — COMPREHENSIVE METABOLIC PANEL
ALBUMIN: 4.3 g/dL (ref 3.5–5.0)
ALT: 36 U/L (ref 14–54)
ANION GAP: 7 (ref 5–15)
AST: 43 U/L — ABNORMAL HIGH (ref 15–41)
Alkaline Phosphatase: 105 U/L (ref 38–126)
BUN: 14 mg/dL (ref 6–20)
CALCIUM: 8.9 mg/dL (ref 8.9–10.3)
CHLORIDE: 107 mmol/L (ref 101–111)
CO2: 25 mmol/L (ref 22–32)
Creatinine, Ser: 0.62 mg/dL (ref 0.44–1.00)
GFR calc non Af Amer: >60 mL/min (ref >60)
GLUCOSE: 110 mg/dL — AB (ref 65–99)
POTASSIUM: 4.5 mmol/L (ref 3.5–5.1)
SODIUM: 139 mmol/L (ref 135–145)
Total Bilirubin: 1.6 mg/dL — ABNORMAL HIGH (ref 0.3–1.2)
Total Protein: 7.5 g/dL (ref 6.5–8.1)

## 2016-11-11 LAB — URINALYSIS, ROUTINE W REFLEX MICROSCOPIC
BACTERIA UA: NONE SEEN
BILIRUBIN URINE: NEGATIVE
Glucose, UA: NEGATIVE mg/dL
HGB URINE DIPSTICK: NEGATIVE
KETONES UR: NEGATIVE mg/dL
LEUKOCYTES UA: NEGATIVE
NITRITE: NEGATIVE
PROTEIN: 30 mg/dL — AB
SPECIFIC GRAVITY, URINE: 1.025 (ref 1.005–1.030)
pH: 5 (ref 5.0–8.0)

## 2016-11-11 LAB — INFLUENZA PANEL BY PCR (TYPE A & B)
INFLAPCR: NEGATIVE
INFLBPCR: NEGATIVE

## 2016-11-11 LAB — I-STAT CG4 LACTIC ACID, ED: Lactic Acid, Venous: 0.93 mmol/L (ref 0.5–1.9)

## 2016-11-11 NOTE — ED Provider Notes (Signed)
[facility]-EMERGENCY DEPT Provider Note   CSN: [id number] Arrival date & time: 11/10/16  1803     History   Chief Complaint Chief Complaint  Patient presents with  . Cough  . Fever    HPI [name] is a 61 y.o. female.  The history is provided by a caregiver. The history is limited by a developmental delay.  Cough  This is a new problem. The current episode started 6 to 12 hours ago. The problem occurs constantly. The problem has not changed since onset.The cough is non-productive. The maximum temperature recorded prior to her arrival was 100 to 100.9 F. The fever has been present for less than 1 day. Pertinent negatives include no rhinorrhea, no sore throat, no shortness of breath and no wheezing. She has tried nothing for the symptoms. She is not a smoker. Her past medical history is significant for pneumonia.  Fever   Associated symptoms include cough. Pertinent negatives include no diarrhea, no vomiting and no sore throat.    Presents from group home with history of cerebral palsy, seizure disorder, and developmental delay. Per caregiver, onset of fever today tmax 100.7 prior to arrival. With cough not productive of sputum. No confusion or lethargy. Normal appetite. No n/v/d or appearing in pain. Normal urine. No sick contacts. History of pneumonia.  Past Medical History:  Diagnosis Date  . Cerebral palsy (HCC)   . Encephalitis   . Mental retardation   . Seizures (HCC)   . Spastic paresis     Patient Active Problem List   Diagnosis Date Noted  . Influenza-like illness 08/21/2016  . 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 Developmental delay 08/21/2016  . Occasional tremors 08/21/2016  . Sepsis (HCC) 08/21/2016  . Pulmonary infiltrate   . Community acquired pneumonia 12/14/2011    History reviewed. No pertinent surgical history.  OB History    No data available       Home Medications    Prior to Admission medications   Medication Sig Start Date End Date  Taking? Authorizing Provider  acetaminophen (TYLENOL) 325 MG tablet Take 650 mg by mouth every 4 (four) hours as needed for mild pain, moderate pain, fever or headache.    Historical Provider, MD  alum & mag hydroxide-simeth (MAALOX/MYLANTA) 200-200-20 MG/5ML suspension Take 15-30 mLs by mouth every 2 (two) hours as needed for indigestion or heartburn.    Historical Provider, MD  bisacodyl (DULCOLAX) 10 MG suppository Place 10 mg rectally as needed for moderate constipation.    Historical Provider, MD  carbamide peroxide (DEBROX) 6.5 % otic solution Place 5 drops into both ears as needed (to remove ear wax).    Historical Provider, MD  chlorhexidine (PERIDEX) 0.12 % solution 15 mLs by Mouth Rinse route 2 (two) times daily. 08/23/16   Merlene Laughter, DO  chlorpheniramine (CHLOR-TRIMETON) 4 MG tablet Take 4 mg by mouth every 8 (eight) hours as needed for allergies.    Historical Provider, MD  Eyelid Cleansers (OCUSOFT EYELID CLEANSING EX) Place 1 application into both eyes every evening.    Historical Provider, MD  guaifenesin (ROBITUSSIN) 100 MG/5ML syrup Take 300 mg by mouth every 4 (four) hours as needed for cough.    Historical Provider, MD  hydrocortisone cream 1 % Apply 1 application topically as needed for itching (and/or bug bites).    Historical Provider, MD  levofloxacin (LEVAQUIN) 750 MG tablet Take 1 tablet (750 mg total) by mouth daily. 08/23/16   Merlene Laughter, DO  lip balm (CARMEX) ointment Apply 1 application topically as needed for lip care.    Historical Provider, MD  loperamide (IMODIUM) 2 MG capsule Take 2-4 mg by mouth as needed for diarrhea or loose stools.    Historical Provider, MD  LORazepam (ATIVAN) 1 MG tablet Take 1 tablet (1 mg total) by mouth 3 (three) times daily. 08/23/16   Merlene Laughtermair Latif Sheikh, DO  magnesium hydroxide (MILK OF MAGNESIA) 400 MG/5ML suspension Take 15-30 mLs by mouth as needed for mild constipation.    Historical Provider, MD  Maltodextrin-Xanthan Gum  (RESOURCE THICKENUP CLEAR) POWD Take 120 g by mouth as needed (on honey  thick liquid diet). 08/23/16   Omair Latif Sheikh, DO  Miconazole Nitrate 2 % AERP Apply 1 application topically at bedtime. Pt applies to buttocks and back.    Historical Provider, MD  mineral oil enema Place 1 enema rectally once as needed for severe constipation.    Historical Provider, MD  mouth rinse LIQD solution 15 mLs by Mouth Rinse route 2 times daily at 12 noon and 4 pm. 08/23/16   Merlene Laughtermair Latif Sheikh, DO  neomycin-bacitracin-polymyxin (NEOSPORIN) [phone] ointment Apply 1 application topically as needed (for wound care).    Historical Provider, MD  polyethylene glycol (MIRALAX / GLYCOLAX) packet Take 17 g by mouth daily.    Historical Provider, MD  promethazine (PHENERGAN) 25 MG tablet Take 25 mg by mouth every 4 (four) hours as needed for nausea or vomiting.    Historical Provider, MD  Skin Protectants, Misc. (MINERIN) CREA Apply 1 application topically 2 (two) times daily. Pt applies to face and arms.    Historical Provider, MD    Family History Family History  Problem Relation Age of Onset  . Family history unknown: Yes    Social History Social History  Substance Use Topics  . Smoking status: Never Smoker  . Smokeless tobacco: Never Used  . Alcohol use Not on file     Allergies   Pseudoephedrine and Sympathomimetics   Review of Systems Review of Systems  Constitutional: Positive for fever.  HENT: Negative for rhinorrhea and sore throat.   Respiratory: Positive for cough. Negative for shortness of breath and wheezing.   Gastrointestinal: Negative for diarrhea and vomiting.  Genitourinary: Negative for difficulty urinating.  Skin: Negative for rash.  Allergic/Immunologic: Negative for immunocompromised state.  Neurological: Negative for syncope.  Hematological: Does not bruise/bleed easily.  Psychiatric/Behavioral: Negative for confusion.  All other systems reviewed and are  negative.    Physical Exam Updated Vital Signs BP 108/87 (BP Location: Left Arm)   Pulse (!) 102   Temp 99.6 F (37.6 C) (Oral)   Resp 18   SpO2 91%   Physical Exam Physical Exam  Nursing note and vitals reviewed. Constitutional:  non-toxic, and in no acute distress Head: Atraumatic.  Mouth/Throat: Oropharynx is clear and moist.  Neck: Normal range of motion. Neck supple.  Cardiovascular: Normal rate and regular rhythm.   Pulmonary/Chest: Effort normal and breath sounds normal.  Abdominal: Soft. There is no tenderness. There is no rebound and no guarding.  Musculoskeletal: No edema Neurological: Alert, no facial droop, PERRL Skin: Skin is warm and dry.  Psychiatric: Cooperative   ED Treatments / Results  Labs (all labs ordered are listed, but only abnormal results are displayed) Labs Reviewed  URINALYSIS, ROUTINE W REFLEX MICROSCOPIC - Abnormal; Notable for the following:       Result Value   Color, Urine AMBER (*)    APPearance HAZY (*)  Protein, ur 30 (*)    Squamous Epithelial / LPF 0-5 (*)    All other components within normal limits  CBC WITH DIFFERENTIAL/PLATELET - Abnormal; Notable for the following:    Platelets 136 (*)    All other components within normal limits  COMPREHENSIVE METABOLIC PANEL - Abnormal; Notable for the following:    Glucose, Bld 110 (*)    AST 43 (*)    Total Bilirubin 1.6 (*)    All other components within normal limits  URINE CULTURE  INFLUENZA PANEL BY PCR (TYPE A & B)  I-STAT CG4 LACTIC ACID, ED    EKG  EKG Interpretation None       Radiology Dg Chest 2 View  Result Date: 11/10/2016 CLINICAL DATA:  Acute onset of cough and fever.  Initial encounter. EXAM: CHEST  2 VIEW COMPARISON:  Chest radiograph performed 08/20/2016 FINDINGS: The lungs are well-aerated and clear. There is no evidence of focal opacification, pleural effusion or pneumothorax. The heart is normal in size; the mediastinal contour is within normal limits.  No acute osseous abnormalities are seen. IMPRESSION: No acute cardiopulmonary process seen. Electronically Signed   By: Roanna Raider M.D.   On: 11/10/2016 23:46    Procedures Procedures (including critical care time)  Medications Ordered in ED Medications  acetaminophen (TYLENOL) tablet 650 mg (650 mg Oral Given 11/10/16 1826)  sodium chloride 0.9 % bolus 1,000 mL (1,000 mLs Intravenous New Bag/Given 11/10/16 2352)     Initial Impression / Assessment and Plan / ED Course  I have reviewed the triage vital signs and the nursing notes.  Pertinent labs & imaging results that were available during my care of the patient were reviewed by me and considered in my medical decision making (see chart for details).     Presents with fever and cough for one day. At baseline mental status per caregiver. Febrile 100.40F in triage, but otherwise hemodynamically stable. Sepsis work-up pursued. CXR visualized and without pneumonia, edema or other acute cardiopulmonary processes. No leukocytosis, normal renal function. Negative for influenza. UA without infection. No evidence of sepsis. Likely viral URI. Will have supportive care continued at group home. Strict return and follow-up instructions reviewed. Caregiver expressed understanding of all discharge instructions and felt comfortable with the plan of care.   Final Clinical Impressions(s) / ED Diagnoses   Final diagnoses:  Viral URI with cough    New Prescriptions New Prescriptions   No medications on file     Lavera Guise, MD 11/11/16 0151

## 2016-11-11 NOTE — ED Notes (Signed)
I-stat lactic is 0.93

## 2016-11-11 NOTE — Discharge Instructions (Signed)
Your infectious work-up is reassuring today. There are no signs of UTI, pneumonia, flu, or other serious infection. This is likely a virus, which can take 1-2 weeks to fully get better on its own.  Continue to take ibuprofen and Tylenol for fever. Return without fail for worsening symptoms, including difficulty breathing, confusion or lethargy, persistent fever > 6-7 days, or any other symptoms concerning to you.

## 2016-11-11 NOTE — ED Notes (Signed)
Pt's caregiver verbalized understanding of discharge instructions.  

## 2016-11-12 LAB — URINE CULTURE

## 2017-01-13 ENCOUNTER — Other Ambulatory Visit: Payer: Self-pay | Admitting: Family Medicine

## 2017-01-13 ENCOUNTER — Ambulatory Visit
Admission: RE | Admit: 2017-01-13 | Discharge: 2017-01-13 | Disposition: A | Discharge status: Home / Self Care | Payer: Medicaid Other | Source: Ambulatory Visit | Attending: Family Medicine | Admitting: Family Medicine

## 2017-01-13 DIAGNOSIS — R52 Pain, unspecified: Secondary | ICD-10-CM

## 2017-01-13 DIAGNOSIS — R609 Edema, unspecified: Secondary | ICD-10-CM

## 2017-09-12 ENCOUNTER — Encounter (HOSPITAL_COMMUNITY): Payer: Self-pay

## 2017-09-12 ENCOUNTER — Emergency Department (HOSPITAL_COMMUNITY)
Admission: EM | Admit: 2017-09-12 | Discharge: 2017-09-13 | Disposition: A | Discharge status: Other Acute Inpatient Hospital | Payer: Medicaid Other | Attending: Emergency Medicine | Admitting: Emergency Medicine

## 2017-09-12 ENCOUNTER — Emergency Department (HOSPITAL_COMMUNITY): Payer: Medicaid Other

## 2017-09-12 ENCOUNTER — Other Ambulatory Visit: Payer: Self-pay

## 2017-09-12 DIAGNOSIS — R251 Tremor, unspecified: Secondary | ICD-10-CM | POA: Diagnosis present

## 2017-09-12 DIAGNOSIS — Z79899 Other long term (current) drug therapy: Secondary | ICD-10-CM | POA: Insufficient documentation

## 2017-09-12 DIAGNOSIS — F79 Unspecified intellectual disabilities: Secondary | ICD-10-CM | POA: Insufficient documentation

## 2017-09-12 DIAGNOSIS — G809 Cerebral palsy, unspecified: Secondary | ICD-10-CM | POA: Diagnosis not present

## 2017-09-12 DIAGNOSIS — R569 Unspecified convulsions: Secondary | ICD-10-CM

## 2017-09-12 LAB — CBC WITH DIFFERENTIAL/PLATELET
BASOS ABS: 0 10*3/uL (ref 0.0–0.1)
Basophils Relative: 0 %
EOS ABS: 0.1 10*3/uL (ref 0.0–0.7)
EOS PCT: 1 %
HCT: 34.3 % — ABNORMAL LOW (ref 36.0–46.0)
Hemoglobin: 11.8 g/dL — ABNORMAL LOW (ref 12.0–15.0)
LYMPHS PCT: 43 %
Lymphs Abs: 2.5 10*3/uL (ref 0.7–4.0)
MCH: 32.6 pg (ref 26.0–34.0)
MCHC: 34.4 g/dL (ref 30.0–36.0)
MCV: 94.8 fL (ref 78.0–100.0)
MONO ABS: 0.5 10*3/uL (ref 0.1–1.0)
Monocytes Relative: 8 %
Neutro Abs: 2.7 10*3/uL (ref 1.7–7.7)
Neutrophils Relative %: 48 %
PLATELETS: 152 10*3/uL (ref 150–400)
RBC: 3.62 MIL/uL — ABNORMAL LOW (ref 3.87–5.11)
RDW: 12.5 % (ref 11.5–15.5)
WBC: 5.8 10*3/uL (ref 4.0–10.5)

## 2017-09-12 LAB — URINALYSIS, ROUTINE W REFLEX MICROSCOPIC
BILIRUBIN URINE: NEGATIVE
Glucose, UA: NEGATIVE mg/dL
HGB URINE DIPSTICK: NEGATIVE
Ketones, ur: NEGATIVE mg/dL
Leukocytes, UA: NEGATIVE
Nitrite: NEGATIVE
PH: 6 (ref 5.0–8.0)
Protein, ur: NEGATIVE mg/dL
SPECIFIC GRAVITY, URINE: 1.013 (ref 1.005–1.030)

## 2017-09-12 LAB — BASIC METABOLIC PANEL
ANION GAP: 6 (ref 5–15)
BUN: 20 mg/dL (ref 6–20)
CALCIUM: 8.9 mg/dL (ref 8.9–10.3)
CO2: 28 mmol/L (ref 22–32)
Chloride: 109 mmol/L (ref 101–111)
Creatinine, Ser: 0.45 mg/dL (ref 0.44–1.00)
GFR calc Af Amer: >60 mL/min (ref >60)
GLUCOSE: 100 mg/dL — AB (ref 65–99)
Potassium: 3.9 mmol/L (ref 3.5–5.1)
SODIUM: 143 mmol/L (ref 135–145)

## 2017-09-12 LAB — POC OCCULT BLOOD, ED: Fecal Occult Bld: NEGATIVE

## 2017-09-12 LAB — I-STAT TROPONIN, ED: Troponin i, poc: 0 ng/mL (ref 0.00–0.08)

## 2017-09-12 MED ORDER — LORAZEPAM 2 MG/ML IJ SOLN
1.0000 mg | Freq: Once | INTRAMUSCULAR | Status: AC
  Administered 2017-09-12: 1 mg via INTRAVENOUS
  Filled 2017-09-12: qty 1

## 2017-09-12 MED ORDER — SODIUM CHLORIDE 0.9 % IV BOLUS (SEPSIS)
1000.0000 mL | Freq: Once | INTRAVENOUS | Status: AC
  Administered 2017-09-12: 1000 mL via INTRAVENOUS

## 2017-09-12 NOTE — ED Notes (Signed)
We attempted to do in and out and was unsuccessful

## 2017-09-12 NOTE — ED Notes (Signed)
Bed: WA09 Expected date:  Expected time:  Means of arrival:  [facility]

## 2017-09-12 NOTE — ED Triage Notes (Signed)
[facility], staff states involuntary tremors of upper extremities, and states new tremors today. Hx of Mental retardation, non verbal, spastic quadriplegia, mild antherosis, seizures, scoliosis and hemiplegia  BP unable to obtain HR 68 resp 18 sp02 97A BGL 105

## 2017-09-12 NOTE — ED Provider Notes (Signed)
[facility]-EMERGENCY DEPT Provider Note   CSN: [id number] Arrival date & time: 09/12/17  1806     History   Chief Complaint Chief Complaint  Patient presents with  . Tremors   Level 5 caveat due to mental retardation and non verbal   HPI [name] is a 62 y.o. female here for evaluation of increased generalized body jerking since this morning noticed by staff at [facility].  Facility staff states pt has "occasional" body jerking but not back to back. No reported fevers, vomiting, diarrhea per facility staff at bedside. Unable to contact Wallie Char (patient's cooperate guardian at [phone], spoke to Stann Mainland [facility] [place], [phone]) who gave consent for patient care as full code.    HPI  Past Medical History:  Diagnosis Date  . Cerebral palsy (HCC)   . Encephalitis   . Mental retardation   . Seizures (HCC)   . Spastic paresis     Patient Active Problem List   Diagnosis Date Noted  . Influenza-like illness 08/21/2016  . 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 Developmental delay 08/21/2016  . Occasional tremors 08/21/2016  . Sepsis (HCC) 08/21/2016  . Pulmonary infiltrate   . Community acquired pneumonia 12/14/2011    History reviewed. No pertinent surgical history.  OB History    No data available       Home Medications    Prior to Admission medications   Medication Sig Start Date End Date Taking? Authorizing Provider  acetaminophen (TYLENOL) 325 MG tablet Take 650 mg by mouth every 4 (four) hours as needed for mild pain, moderate pain, fever or headache.   Yes [provider]  alum & mag hydroxide-simeth (MAALOX/MYLANTA) 200-200-20 MG/5ML suspension Take 15-30 mLs by mouth every 2 (two) hours as needed for indigestion or heartburn.   Yes [provider]  bisacodyl (DULCOLAX) 10 MG suppository Place 10 mg rectally as needed for moderate constipation.   Yes [provider]    carbamide peroxide (DEBROX) 6.5 % otic solution Place 5 drops into both ears as needed (to remove ear wax).   Yes [provider]  chlorpheniramine (CHLOR-TRIMETON) 4 MG tablet Take 4 mg by mouth every 8 (eight) hours as needed for allergies.   Yes [provider]  diphenhydrAMINE (BENADRYL) 25 mg capsule Take 25 mg by mouth every 4 (four) hours as needed for itching or allergies (nasal congestion).   Yes [provider]  Eyelid Cleansers (OCUSOFT EYELID CLEANSING EX) Place 1 application into both eyes every evening.   Yes [provider]  guaifenesin (ROBITUSSIN) 100 MG/5ML syrup Take 300 mg by mouth every 4 (four) hours as needed for cough.   Yes [provider]  hydrocortisone cream 1 % Apply 1 application topically as needed for itching (and/or bug bites).   Yes [provider]  lip balm (CARMEX) ointment Apply 1 application topically as needed for lip care.   Yes [provider]  loperamide (IMODIUM) 2 MG capsule Take 2-4 mg by mouth as needed for diarrhea or loose stools.   Yes [provider]  LORazepam (ATIVAN) 1 MG tablet Take 1 tablet (1 mg total) by mouth 3 (three) times daily. 08/23/16  Yes Sheikh, Omair Latif, DO  magnesium hydroxide (MILK OF MAGNESIA) 400 MG/5ML suspension Take 15-30 mLs by mouth as needed for mild constipation.   Yes [provider]  Miconazole Nitrate 2 % AERP Apply 1 application topically at bedtime. Pt applies  to buttocks and back.   Yes [provider]  mineral oil enema Place 1 enema rectally once as needed for severe constipation.   Yes [provider]  neomycin-bacitracin-polymyxin (NEOSPORIN) [phone] ointment Apply 1 application topically as needed (for wound care).   Yes [provider]  polyethylene glycol (MIRALAX / GLYCOLAX) packet Take 17 g by mouth daily.   Yes [provider]  promethazine (PHENERGAN) 25 MG tablet Take 25 mg by mouth  every 4 (four) hours as needed for nausea or vomiting.   Yes [provider]  Skin Protectants, Misc. (MINERIN) CREA Apply 1 application topically 2 (two) times daily. Pt applies to face and arms.   Yes [provider]  chlorhexidine (PERIDEX) 0.12 % solution 15 mLs by Mouth Rinse route 2 (two) times daily. Patient not taking: Reported on 09/12/2017 08/23/16   Marguerita Merles Latif, DO  levofloxacin (LEVAQUIN) 750 MG tablet Take 1 tablet (750 mg total) by mouth daily. Patient not taking: Reported on 09/12/2017 08/23/16   Marguerita Merles Latif, DO  Maltodextrin-Xanthan Gum (RESOURCE THICKENUP CLEAR) POWD Take 120 g by mouth as needed (on honey  thick liquid diet). Patient not taking: Reported on 09/12/2017 08/23/16   Marguerita Merles Latif, DO  mouth rinse LIQD solution 15 mLs by Mouth Rinse route 2 times daily at 12 noon and 4 pm. Patient not taking: Reported on 09/12/2017 08/23/16   Merlene Laughter, DO    Family History Family History  Family history unknown: Yes    Social History Social History   Tobacco Use  . Smoking status: Never Smoker  . Smokeless tobacco: Never Used  Substance Use Topics  . Alcohol use: Not on file  . Drug use: Not on file     Allergies   Pseudoephedrine and Sympathomimetics   Review of Systems Review of Systems  Unable to perform ROS: Patient nonverbal  Neurological: Positive for tremors.     Physical Exam Updated Vital Signs BP 106/72   Pulse (!) 51   Temp 98 F (36.7 C) (Oral)   Resp (!) 9   SpO2 95%   Physical Exam  Constitutional: She appears well-developed and well-nourished. No distress.  Awake. Non verbal. Intermittent, frequent whole body jerking.   HENT:  Head: Normocephalic and atraumatic.  Nose: Nose normal.  Mouth/Throat: No oropharyngeal exudate.  Moist mucous membranes. No intraoral or tongue injury. Posterior oropharynx normal.   Eyes: Conjunctivae are normal.  PERRL intact bilaterally.   Cardiovascular: Normal  rate, regular rhythm and intact distal pulses.  No murmur heard. 2+ DP and radial pulses bilaterally. No LE edema.   Pulmonary/Chest: Effort normal. No respiratory distress.  Decreased breath sounds to lower lobes difficult exam due to body positioning. No wheezing or rales.   Abdominal: Soft. Bowel sounds are normal. There is no tenderness.  No G/R/R. No wincing with abdominal palpation.   Genitourinary:  Genitourinary Comments:  EMT present during DRE Patient without obvious pain in perianal/rectal area with palpation.  There are no external fissures or external hemorrhoids noted.  No induration or swelling of the perianal skin.   Stool color is brown with no gross blood noted.   No signs of perirectal abscess.    Musculoskeletal: Normal range of motion. She exhibits no deformity.  Contractures in all extremities. Muscle wasting to lower extremities.   Neurological: She is alert.  Spasticity to all extremities.   Skin: Skin is warm and dry. Capillary refill takes less than 2 seconds.  Nursing  note and vitals reviewed.    ED Treatments / Results  Labs (all labs ordered are listed, but only abnormal results are displayed) Labs Reviewed  CBC WITH DIFFERENTIAL/PLATELET - Abnormal; Notable for the following components:      Result Value   RBC 3.62 (*)    Hemoglobin 11.8 (*)    HCT 34.3 (*)    All other components within normal limits  BASIC METABOLIC PANEL - Abnormal; Notable for the following components:   Glucose, Bld 100 (*)    All other components within normal limits  URINE CULTURE  URINALYSIS, ROUTINE W REFLEX MICROSCOPIC  POC OCCULT BLOOD, ED  I-STAT TROPONIN, ED    EKG  EKG Interpretation None       Radiology Dg Chest 1 View  Result Date: 09/12/2017 CLINICAL DATA:  Seizure EXAM: CHEST 1 VIEW COMPARISON:  11/10/2016 FINDINGS: Severe thoracolumbar scoliosis. Heart is normal size. Lungs are clear. No effusions or acute bony abnormality. IMPRESSION: No active  disease. Electronically Signed   By: Charlett NoseKevin  Dover M.D.   On: 09/12/2017 20:03   Ct Head Wo Contrast  Result Date: 09/12/2017 CLINICAL DATA:  [facility], staff states involuntary tremors of upper extremities, and states new tremors today. Hx of Mental retardation, non verbal, spastic quadriplegia, seizures, scoliosis EXAM: CT HEAD WITHOUT CONTRAST TECHNIQUE: Contiguous axial images were obtained from the base of the skull through the vertex without intravenous contrast. COMPARISON:  10/06/2004 FINDINGS: Brain: Central and cortical atrophy. Periventricular white matter changes are consistent with small vessel disease. There is no intra or extra-axial fluid collection or mass lesion. The basilar cisterns and ventricles have a normal appearance. There is no CT evidence for acute infarction or hemorrhage. Vascular: No hyperdense vessel or unexpected calcification. Skull: Diffuse calvarial thickening appear stable. Sinuses/Orbits: Mucoperiosteal thickening of the sphenoid air cells. Prominent frontal sinuses are normally aerated. Other: None IMPRESSION: 1.  No evidence for acute intracranial abnormality. 2. Atrophy and small vessel disease. 3. Diffuse calvarial thickening. Electronically Signed   By: Norva PavlovElizabeth  Brown M.D.   On: 09/12/2017 21:51    Procedures Procedures (including critical care time)  Medications Ordered in ED Medications  sodium chloride 0.9 % bolus 1,000 mL (0 mLs Intravenous Stopped 09/12/17 2324)  LORazepam (ATIVAN) injection 1 mg (1 mg Intravenous Given 09/12/17 2040)  LORazepam (ATIVAN) injection 1 mg (1 mg Intravenous Given 09/12/17 2324)     Initial Impression / Assessment and Plan / ED Course  I have reviewed the triage vital signs and the nursing notes.  Pertinent labs & imaging results that were available during my care of the patient were reviewed by me and considered in my medical decision making (see chart for details).  Clinical Course as of  Sep 14 235  Mon Sep 12, 2017  2309 Reevaluated patient. Talked to facility representative at bedside regarding benign workup so far. Patient asleep without tremors. Nurse's having a hard time getting urine due to patient's spasms, will dose Ativan and attempt again.  [CG]    Clinical Course User Index [CG] Liberty HandyGibbons, Shawnell Dykes J, PA-C   Concern for possible infection causing increased spastic activity/jerking. Pt does not look like she is seizing. She has documented seizures on her chart however not currently on any anticonvulsants. Previous providers have noted intermittent jerking/spasticity before.  Has lorazapam to be used prn, but unclear if any was used at facility PTA. No reported fevers, head trauma, vomiting, diarrhea, changes in urine or recent med changes.  Will evaluate for infection.  Final Clinical Impressions(s) / ED Diagnoses   Lab work reassuring. No evidence of infection. Pt was given lorazapam in ED which stopped jerking. Re-evaluation reassuring, she is comfortably asleep. Will dc with close re-evaluation in 24-36 hours to ensure no clinical decline. Discussed return precautions with facility staff at bedside. I contacted Stann Mainland ([employer] [facility]) and made aware of plan to discharge.  Final diagnoses:  Tremor    ED Discharge Orders    None       Jerrell Mylar 09/13/17 6962    Benjiman Core, MD 09/13/17 2329

## 2017-09-13 NOTE — Discharge Instructions (Signed)
ED work up today was reassuring. No urinary tract infection or pneumonia. CT head without acute or new changes.   Please have re-evaluation in 24-48 hours for evaluation of clinical decline. Continue all medications as prescribed.   Return for fevers, seizures, cough, vomiting, diarrhea, changes in urine.

## 2017-09-13 NOTE — ED Notes (Signed)
Waiting on caregiver to return with special wheelchair.

## 2017-09-14 LAB — URINE CULTURE: Culture: NO GROWTH

## 2020-08-22 ENCOUNTER — Emergency Department (HOSPITAL_COMMUNITY)
Admission: EM | Admit: 2020-08-22 | Discharge: 2020-08-22 | Disposition: A | Discharge status: Home / Self Care | Payer: Medicare Other | Attending: Emergency Medicine | Admitting: Emergency Medicine

## 2020-08-22 ENCOUNTER — Emergency Department (HOSPITAL_COMMUNITY): Payer: Medicare Other

## 2020-08-22 DIAGNOSIS — R4182 Altered mental status, unspecified: Secondary | ICD-10-CM | POA: Insufficient documentation

## 2020-08-22 DIAGNOSIS — K5900 Constipation, unspecified: Secondary | ICD-10-CM | POA: Diagnosis not present

## 2020-08-22 DIAGNOSIS — Z8669 Personal history of other diseases of the nervous system and sense organs: Secondary | ICD-10-CM | POA: Diagnosis not present

## 2020-08-22 DIAGNOSIS — N3000 Acute cystitis without hematuria: Secondary | ICD-10-CM

## 2020-08-22 DIAGNOSIS — N390 Urinary tract infection, site not specified: Secondary | ICD-10-CM | POA: Insufficient documentation

## 2020-08-22 DIAGNOSIS — Z20822 Contact with and (suspected) exposure to covid-19: Secondary | ICD-10-CM | POA: Insufficient documentation

## 2020-08-22 LAB — URINALYSIS, ROUTINE W REFLEX MICROSCOPIC
Bilirubin Urine: NEGATIVE
Glucose, UA: NEGATIVE mg/dL
Ketones, ur: NEGATIVE mg/dL
Nitrite: NEGATIVE
Protein, ur: 100 mg/dL — AB
Specific Gravity, Urine: 1.025 (ref 1.005–1.030)
pH: 6.5 (ref 5.0–8.0)

## 2020-08-22 LAB — COMPREHENSIVE METABOLIC PANEL
ALT: 80 U/L — ABNORMAL HIGH (ref 0–44)
AST: 66 U/L — ABNORMAL HIGH (ref 15–41)
Albumin: 3.6 g/dL (ref 3.5–5.0)
Alkaline Phosphatase: 76 U/L (ref 38–126)
Anion gap: 11 (ref 5–15)
BUN: 43 mg/dL — ABNORMAL HIGH (ref 8–23)
CO2: 30 mmol/L (ref 22–32)
Calcium: 8.9 mg/dL (ref 8.9–10.3)
Chloride: 105 mmol/L (ref 98–111)
Creatinine, Ser: 0.67 mg/dL (ref 0.44–1.00)
GFR, Estimated: >60 mL/min (ref >60)
Glucose, Bld: 104 mg/dL — ABNORMAL HIGH (ref 70–99)
Potassium: 3.6 mmol/L (ref 3.5–5.1)
Sodium: 146 mmol/L — ABNORMAL HIGH (ref 135–145)
Total Bilirubin: 1 mg/dL (ref 0.3–1.2)
Total Protein: 6.3 g/dL — ABNORMAL LOW (ref 6.5–8.1)

## 2020-08-22 LAB — CBC WITH DIFFERENTIAL/PLATELET
Abs Immature Granulocytes: 0.02 10*3/uL (ref 0.00–0.07)
Basophils Absolute: 0 10*3/uL (ref 0.0–0.1)
Basophils Relative: 0 %
Eosinophils Absolute: 0.1 10*3/uL (ref 0.0–0.5)
Eosinophils Relative: 2 %
HCT: 42.1 % (ref 36.0–46.0)
Hemoglobin: 13.4 g/dL (ref 12.0–15.0)
Immature Granulocytes: 0 %
Lymphocytes Relative: 23 %
Lymphs Abs: 1.4 10*3/uL (ref 0.7–4.0)
MCH: 31.7 pg (ref 26.0–34.0)
MCHC: 31.8 g/dL (ref 30.0–36.0)
MCV: 99.5 fL (ref 80.0–100.0)
Monocytes Absolute: 0.5 10*3/uL (ref 0.1–1.0)
Monocytes Relative: 9 %
Neutro Abs: 4 10*3/uL (ref 1.7–7.7)
Neutrophils Relative %: 66 %
Platelets: 144 10*3/uL — ABNORMAL LOW (ref 150–400)
RBC: 4.23 MIL/uL (ref 3.87–5.11)
RDW: 12.3 % (ref 11.5–15.5)
WBC: 6 10*3/uL (ref 4.0–10.5)
nRBC: 0 % (ref 0.0–0.2)

## 2020-08-22 LAB — LACTIC ACID, PLASMA: Lactic Acid, Venous: 1.7 mmol/L (ref 0.5–1.9)

## 2020-08-22 LAB — URINALYSIS, MICROSCOPIC (REFLEX): WBC, UA: >50 WBC/hpf (ref 0–5)

## 2020-08-22 LAB — AMMONIA: Ammonia: 18 umol/L (ref 9–35)

## 2020-08-22 LAB — SARS CORONAVIRUS 2 (TAT 6-24 HRS): SARS Coronavirus 2: NEGATIVE

## 2020-08-22 MED ORDER — ONDANSETRON HCL 4 MG/2ML IJ SOLN
4.0000 mg | Freq: Once | INTRAMUSCULAR | Status: AC
  Administered 2020-08-22: 4 mg via INTRAVENOUS
  Filled 2020-08-22: qty 2

## 2020-08-22 MED ORDER — SODIUM CHLORIDE 0.9 % IV SOLN
1.0000 g | Freq: Once | INTRAVENOUS | Status: AC
  Administered 2020-08-22: 1 g via INTRAVENOUS
  Filled 2020-08-22: qty 10

## 2020-08-22 MED ORDER — SODIUM CHLORIDE 0.9 % IV BOLUS
1000.0000 mL | Freq: Once | INTRAVENOUS | Status: AC
  Administered 2020-08-22: 1000 mL via INTRAVENOUS

## 2020-08-22 MED ORDER — IOHEXOL 300 MG/ML  SOLN
75.0000 mL | Freq: Once | INTRAMUSCULAR | Status: AC | PRN
  Administered 2020-08-22: 75 mL via INTRAVENOUS

## 2020-08-22 MED ORDER — FENTANYL CITRATE (PF) 100 MCG/2ML IJ SOLN
50.0000 ug | Freq: Once | INTRAMUSCULAR | Status: AC
  Administered 2020-08-22: 50 ug via INTRAVENOUS
  Filled 2020-08-22: qty 2

## 2020-08-22 MED ORDER — POLYETHYLENE GLYCOL 3350 17 G PO PACK: 17.0000 g | PACK | Freq: Every day | ORAL | 0 refills | Status: AC | PRN

## 2020-08-22 MED ORDER — CEPHALEXIN 500 MG PO CAPS: 500.0000 mg | ORAL_CAPSULE | Freq: Two times a day (BID) | ORAL | 0 refills | Status: AC

## 2020-08-22 NOTE — ED Provider Notes (Signed)
Patient found to have constipation and urinary tract infection.  Hemodynamically stable.  No concern for sepsis.  Appears to be mostly at her baseline.  Talked with facility and they understand given antibiotics and MiraLAX for constipation.  She was given a dose of IV Rocephin here.  Discharged in good condition.  Understand return precautions.  This chart was dictated using voice recognition software.  Despite best efforts to proofread,  errors can occur which can change the documentation meaning.     Virgina Norfolk, DO 08/22/20 1940

## 2020-08-22 NOTE — ED Provider Notes (Signed)
[facility] EMERGENCY DEPARTMENT Provider Note   CSN: [id number] Arrival date & time: 08/22/20  1053     History Chief Complaint  Patient presents with  . Altered Mental Status    [name] is a 65 y.o. female.  With a past medical history of cerebral palsy.  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  She lives at [facility] health services.  There is a level 5 caveat is the patient is nonverbal I was able to contact the nurse who has not been taking care of her recently as her regular nurses are both out due to the pandemic.  The patient apparently did not receive her regular doses of Ativan.  On Monday the nurse who took care of her was concerned that she had increased tremulousness that is due to not being on her medications and she was started back on them.  Apparently this morning staff went to go get her up and she seemed lethargic and was not responding normally so they brought her in.  Triage nurse states that the patient has been moaning and wincing as if she is in pain.   HPI     Past Medical History:  Diagnosis Date  . Cerebral palsy (HCC)   . Encephalitis   . Mental retardation   . Seizures (HCC)   . Spastic paresis [facility])     Patient Active Problem List   Diagnosis Date Noted  . Influenza-like illness 08/21/2016  . 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 Developmental delay 08/21/2016  . Occasional tremors 08/21/2016  . Sepsis (HCC) 08/21/2016  . Pulmonary infiltrate   . Community acquired pneumonia 12/14/2011    No past surgical history on file.   OB History   No obstetric history on file.     Family History  Family history unknown: Yes    Social History   Tobacco Use  . Smoking status: Never Smoker  . Smokeless tobacco: Never Used    Home Medications Prior to Admission medications   Medication Sig Start Date End Date Taking? Authorizing Provider  acetaminophen (TYLENOL) 325 MG tablet Take 650 mg by  mouth every 4 (four) hours as needed for mild pain, moderate pain, fever or headache.    [provider]  alum & mag hydroxide-simeth (MAALOX/MYLANTA) 200-200-20 MG/5ML suspension Take 15-30 mLs by mouth every 2 (two) hours as needed for indigestion or heartburn.    [provider]  bisacodyl (DULCOLAX) 10 MG suppository Place 10 mg rectally as needed for moderate constipation.    [provider]  carbamide peroxide (DEBROX) 6.5 % otic solution Place 5 drops into both ears as needed (to remove ear wax).    [provider]  chlorhexidine (PERIDEX) 0.12 % solution 15 mLs by Mouth Rinse route 2 (two) times daily. Patient not taking: Reported on 09/12/2017 08/23/16   Marguerita Merles Latif, DO  chlorpheniramine (CHLOR-TRIMETON) 4 MG tablet Take 4 mg by mouth every 8 (eight) hours as needed for allergies.    [provider]  diphenhydrAMINE (BENADRYL) 25 mg capsule Take 25 mg by mouth every 4 (four) hours as needed for itching or allergies (nasal congestion).    [provider]  Eyelid Cleansers (OCUSOFT EYELID CLEANSING EX) Place 1 application into both eyes every evening.    [provider]  guaifenesin (ROBITUSSIN) 100 MG/5ML syrup Take 300 mg by mouth every 4 (four) hours as needed for cough.    [provider]  hydrocortisone cream 1 % Apply 1 application topically as needed for itching (and/or bug bites).    [provider]  levofloxacin (LEVAQUIN) 750 MG tablet Take 1 tablet (750 mg total) by mouth daily. Patient not taking: Reported on 09/12/2017 08/23/16   Raiford Noble Latif, DO  lip balm (CARMEX) ointment Apply 1 application topically as needed for lip care.    [provider]  loperamide (IMODIUM) 2 MG capsule Take 2-4 mg by mouth as needed for diarrhea or loose stools.    [provider]  LORazepam (ATIVAN) 1 MG tablet Take 1 tablet (1 mg total) by mouth 3 (three) times daily. 08/23/16   Raiford Noble  Latif, DO  magnesium hydroxide (MILK OF MAGNESIA) 400 MG/5ML suspension Take 15-30 mLs by mouth as needed for mild constipation.    [provider]  Maltodextrin-Xanthan Gum (RESOURCE THICKENUP CLEAR) POWD Take 120 g by mouth as needed (on honey  thick liquid diet). Patient not taking: Reported on 09/12/2017 08/23/16   Raiford Noble Latif, DO  Miconazole Nitrate 2 % AERP Apply 1 application topically at bedtime. Pt applies to buttocks and back.    [provider]  mineral oil enema Place 1 enema rectally once as needed for severe constipation.    [provider]  mouth rinse LIQD solution 15 mLs by Mouth Rinse route 2 times daily at 12 noon and 4 pm. Patient not taking: Reported on 09/12/2017 08/23/16   Raiford Noble Latif, DO  neomycin-bacitracin-polymyxin (NEOSPORIN) [phone] ointment Apply 1 application topically as needed (for wound care).    [provider]  polyethylene glycol (MIRALAX / GLYCOLAX) packet Take 17 g by mouth daily.    [provider]  promethazine (PHENERGAN) 25 MG tablet Take 25 mg by mouth every 4 (four) hours as needed for nausea or vomiting.    [provider]  Skin Protectants, Misc. (MINERIN) CREA Apply 1 application topically 2 (two) times daily. Pt applies to face and arms.    [provider]    Allergies    Pseudoephedrine and Sympathomimetics  Review of Systems   Review of Systems Unable to review systems due to nonverbal patient Physical Exam Updated Vital Signs BP 111/83 (BP Location: Right Arm)   Pulse (!) 59   Temp 98.3 F (36.8 C) (Rectal)   Resp 16   SpO2 97%   Physical Exam Vitals and nursing note reviewed. Exam conducted with a chaperone present.  Constitutional:      Comments: Patient is of small stature.  She is chronically contracted in the shape of a small C.  Her knees lie to the left side and rest just above the umbilicus.  The patient is moaning and making wincing faces as if she  is in pain  HENT:     Head: Normocephalic and atraumatic.     Right Ear: Tympanic membrane normal.     Nose: Nose normal.     Mouth/Throat:     Mouth: Mucous membranes are moist.  Eyes:     Extraocular Movements: Extraocular movements intact.     Pupils: Pupils are equal, round, and reactive to light.  Cardiovascular:     Rate and Rhythm: Normal rate.  Pulmonary:     Effort: Pulmonary effort is normal.  Abdominal:     Tenderness: There is abdominal tenderness.     Comments: Patient begins to cry with palpation of her abdomen  Skin:    General: Skin is warm.  Neurological:  GCS: GCS eye subscore is 4.     Comments: Patient makes eye contact when her name is called.     ED Results / Procedures / Treatments   Labs (all labs ordered are listed, but only abnormal results are displayed) Labs Reviewed  SARS CORONAVIRUS 2 (TAT 6-24 HRS)  CBC WITH DIFFERENTIAL/PLATELET  COMPREHENSIVE METABOLIC PANEL  URINALYSIS, ROUTINE W REFLEX MICROSCOPIC  LACTIC ACID, PLASMA  AMMONIA  CBG MONITORING, ED    EKG None  Radiology No results found.  Procedures Procedures (including critical care time)  Medications Ordered in ED Medications  fentaNYL (SUBLIMAZE) injection 50 mcg (has no administration in time range)  ondansetron (ZOFRAN) injection 4 mg (has no administration in time range)    ED Course  I have reviewed the triage vital signs and the nursing notes.  Pertinent labs & imaging results that were available during my care of the patient were reviewed by me and considered in my medical decision making (see chart for details).    MDM Rules/Calculators/A&P                          64 year old female here with altered mental status. The differential diagnosis for AMS is extensive and includes, but is not limited to: drug overdose - opioids, alcohol, sedatives, antipsychotics, drug withdrawal, others; Metabolic: hypoxia, hypoglycemia, hyperglycemia, hypercalcemia,  hypernatremia, hyponatremia, uremia, hepatic encephalopathy, hypothyroidism, hyperthyroidism, vitamin B12 or thiamine deficiency, carbon monoxide poisoning, Wilson's disease, Lactic acidosis, DKA/HHOS; Infectious: meningitis, encephalitis, bacteremia/sepsis, urinary tract infection, pneumonia, neurosyphilis; Structural: Space-occupying lesion, (brain tumor, subdural hematoma, hydrocephalus,); Vascular: stroke, subarachnoid hemorrhage, coronary ischemia, hypertensive encephalopathy, CNS vasculitis, thrombotic thrombocytopenic purpura, disseminated intravascular coagulation, hyperviscosity; Psychiatric: Schizophrenia, depression; Other: Seizure, hypothermia, heat stroke, ICU psychosis, dementia -"sundowning."  I ordered and reviewed labs which include CBC which shows no significant abnormalities, CMP with mild elevated glucose.  Slightly elevated AST ALT, urine appears potentially infected..  Ammonia and lactic acid within normal limits.  Currently awaiting CT head and abdomen.,  Portable 1 view chest x-ray.  I have given signout to Dr. Lockie Mola who will assume care of the patient. Final Clinical Impression(s) / ED Diagnoses Final diagnoses:  None    Rx / DC Orders ED Discharge Orders    None       Arthor Captain, PA-C 08/22/20 1528    Derwood Kaplan, MD 08/23/20 [phone]

## 2020-08-22 NOTE — ED Notes (Signed)
Call [facility] when pt is discharged [phone].

## 2020-08-22 NOTE — ED Triage Notes (Signed)
Patient arrived via [facility] from nursing home. Per EMS patient is not herself and continuous to moan and groan as if she was hurting.  All vital signs within normal limits.  EMS VS- 118/90,94%,18 [id number]

## 2020-08-22 NOTE — Discharge Instructions (Signed)
Take antibiotic for urinary tract infection.  Continue MiraLAX for constipation.

## 2020-08-24 LAB — URINE CULTURE: Culture: >=100000 — AB

## 2021-08-23 ENCOUNTER — Emergency Department (HOSPITAL_COMMUNITY): Payer: Medicare Other

## 2021-08-23 ENCOUNTER — Encounter (HOSPITAL_COMMUNITY): Payer: Self-pay | Admitting: Oncology

## 2021-08-23 ENCOUNTER — Emergency Department (HOSPITAL_COMMUNITY)
Admission: EM | Admit: 2021-08-23 | Discharge: 2021-08-23 | Disposition: A | Discharge status: Home / Self Care | Payer: Medicare Other | Attending: Emergency Medicine | Admitting: Emergency Medicine

## 2021-08-23 ENCOUNTER — Other Ambulatory Visit: Payer: Self-pay

## 2021-08-23 DIAGNOSIS — Z20822 Contact with and (suspected) exposure to covid-19: Secondary | ICD-10-CM | POA: Diagnosis not present

## 2021-08-23 DIAGNOSIS — G809 Cerebral palsy, unspecified: Secondary | ICD-10-CM

## 2021-08-23 DIAGNOSIS — R251 Tremor, unspecified: Secondary | ICD-10-CM | POA: Insufficient documentation

## 2021-08-23 DIAGNOSIS — Z79899 Other long term (current) drug therapy: Secondary | ICD-10-CM | POA: Insufficient documentation

## 2021-08-23 DIAGNOSIS — E860 Dehydration: Secondary | ICD-10-CM | POA: Diagnosis not present

## 2021-08-23 LAB — URINALYSIS, ROUTINE W REFLEX MICROSCOPIC
Bilirubin Urine: NEGATIVE
Glucose, UA: NEGATIVE mg/dL
Hgb urine dipstick: NEGATIVE
Ketones, ur: NEGATIVE mg/dL
Nitrite: NEGATIVE
Protein, ur: NEGATIVE mg/dL
Specific Gravity, Urine: 1.013 (ref 1.005–1.030)
pH: 7 (ref 5.0–8.0)

## 2021-08-23 LAB — COMPREHENSIVE METABOLIC PANEL
ALT: 15 U/L (ref 0–44)
AST: 18 U/L (ref 15–41)
Albumin: 4.1 g/dL (ref 3.5–5.0)
Alkaline Phosphatase: 106 U/L (ref 38–126)
Anion gap: 4 — ABNORMAL LOW (ref 5–15)
BUN: 18 mg/dL (ref 8–23)
CO2: 29 mmol/L (ref 22–32)
Calcium: 8.8 mg/dL — ABNORMAL LOW (ref 8.9–10.3)
Chloride: 106 mmol/L (ref 98–111)
Creatinine, Ser: 0.58 mg/dL (ref 0.44–1.00)
GFR, Estimated: >60 mL/min (ref >60)
Glucose, Bld: 99 mg/dL (ref 70–99)
Potassium: 4 mmol/L (ref 3.5–5.1)
Sodium: 139 mmol/L (ref 135–145)
Total Bilirubin: 0.8 mg/dL (ref 0.3–1.2)
Total Protein: 7 g/dL (ref 6.5–8.1)

## 2021-08-23 LAB — CBC WITH DIFFERENTIAL/PLATELET
Abs Immature Granulocytes: 0.01 10*3/uL (ref 0.00–0.07)
Basophils Absolute: 0 10*3/uL (ref 0.0–0.1)
Basophils Relative: 1 %
Eosinophils Absolute: 0.1 10*3/uL (ref 0.0–0.5)
Eosinophils Relative: 1 %
HCT: 36.9 % (ref 36.0–46.0)
Hemoglobin: 12.3 g/dL (ref 12.0–15.0)
Immature Granulocytes: 0 %
Lymphocytes Relative: 31 %
Lymphs Abs: 1.9 10*3/uL (ref 0.7–4.0)
MCH: 32 pg (ref 26.0–34.0)
MCHC: 33.3 g/dL (ref 30.0–36.0)
MCV: 96.1 fL (ref 80.0–100.0)
Monocytes Absolute: 0.5 10*3/uL (ref 0.1–1.0)
Monocytes Relative: 8 %
Neutro Abs: 3.7 10*3/uL (ref 1.7–7.7)
Neutrophils Relative %: 59 %
Platelets: 184 10*3/uL (ref 150–400)
RBC: 3.84 MIL/uL — ABNORMAL LOW (ref 3.87–5.11)
RDW: 12.1 % (ref 11.5–15.5)
WBC: 6.2 10*3/uL (ref 4.0–10.5)
nRBC: 0 % (ref 0.0–0.2)

## 2021-08-23 LAB — RESP PANEL BY RT-PCR (FLU A&B, COVID) ARPGX2
Influenza A by PCR: NEGATIVE
Influenza B by PCR: NEGATIVE
SARS Coronavirus 2 by RT PCR: NEGATIVE

## 2021-08-23 LAB — CBG MONITORING, ED: Glucose-Capillary: 103 mg/dL — ABNORMAL HIGH (ref 70–99)

## 2021-08-23 MED ORDER — SODIUM CHLORIDE 0.9 % IV BOLUS
500.0000 mL | Freq: Once | INTRAVENOUS | Status: AC
  Administered 2021-08-23: 500 mL via INTRAVENOUS

## 2021-08-23 NOTE — ED Triage Notes (Signed)
Pt bib [facility] from [facility] group home d/t increase in tremors.  Per EMS pt has CP and tremors at baseline. Staff stated tremors seemed worse today. Pt given 1 mg ativan at 1300 as well as another 1 mg at 1400 to help.  Pt is non verbal.  RN from group home reported to fire captain that she believes the tremors are caused by, "Drama from staff."  EMS could not elaborate further.

## 2021-08-23 NOTE — ED Notes (Signed)
Patient transported to CT 

## 2021-08-23 NOTE — ED Provider Notes (Signed)
°  Face-to-face evaluation   History: Elderly female, lives in a group home, sent here today because of "tremor."  She is unable to give any history.  I evaluated the patient at 8:15 PM.  At this time she is with her group home supervisor that states that the patient is at her baseline.  She is not currently having anything unusual for her, but a baseline tremor is present.  Physical exam: Alert female who is alert and interactive.  She is nonverbal.  She has flexion contractures of the arms or legs bilaterally.  Abdomen soft and nontender.  MDM: Evaluation for  Chief Complaint  Patient presents with   Tremors     Elderly female, with cerebral palsy, presenting with nonspecific shaking.  She is reported to be at her baseline by a provider who knows her well.  There is no indication for further evaluation or intervention at this time.  Medical screening examination/treatment/procedure(s) were conducted as a shared visit with non-physician practitioner(s) and myself.  I personally evaluated the patient during the encounter    Daleen Bo, MD 08/23/21 [phone]

## 2021-08-23 NOTE — ED Provider Notes (Addendum)
[facility] DEPT Provider Note   CSN: [id number] Arrival date & time: 08/23/21  1518     History  Chief Complaint  Patient presents with   Tremors    [name] is a 66 y.o. female.  Patient with history of CP presents today from her group home for chief complaint of worsening tremors and AMS. Of note, patient is nonverbal and unable to follow commands at baseline and therefore history provided by caregiver from group home who is present in the room. She states that patient has been acting differently today with tremors that are more sever than her baseline. She states that the patient normally receives 55m of Ativan TID, however due to breakthrough tremors she has received two additional doses of Ativan today. Caregiver states that patient is nonverbal and unable to follow commands at baseline but is normally more alert and smiling than normal. She also states that she is concerned that the patient is dehydrated because when this has happened in the past she has needed IV rehydration. Also states that the patient has been having less bowel movements and urinating less than normal recently. Of note, caregiver from group home states that the nurse who takes care of the patient did not feel that the patient needed to come to the hospital today, however the caregiver states that she overrided the nurses decision because 'I know the patient better than the nurse does.' Caregiver also states that the patient has had a cough recently which is new.   Level 5 caveat--patient nonverbal  The history is provided by a caregiver. No language interpreter was used.      Home Medications Prior to Admission medications   Medication Sig Start Date End Date Taking? Authorizing Provider  chlorpheniramine (CHLOR-TRIMETON) 4 MG tablet Take 4 mg by mouth every 8 (eight) hours as needed for allergies.    [provider]  Eyelid Cleansers (OCUSOFT EYELID CLEANSING EX)  Place 1 application into both eyes See admin instructions. Wash both eyelids every evening for eye hygiene.    [provider]  LORazepam (ATIVAN) 1 MG tablet Take 1 tablet (1 mg total) by mouth 3 (three) times daily. 08/23/16   Sheikh, OGeorgina QuintLatif, DO  miconazole (MICOTIN) 2 % powder Apply 1 application topically See admin instructions. Apply topically to buttocks and back daily after bath for skin care.    [provider]  neomycin-bacitracin-polymyxin (NEOSPORIN) [phone] ointment Apply 1 application topically daily as needed (for wound care).    [provider]  polyethylene glycol (MIRALAX / GLYCOLAX) 17 g packet Take 17 g by mouth daily as needed for up to 30 doses for moderate constipation. 08/22/20   Curatolo, Adam, DO  promethazine (PHENERGAN) 25 MG suppository Place 25 mg rectally every 4 (four) hours as needed for nausea or vomiting.    [provider]  promethazine (PHENERGAN) 25 MG tablet Take 25 mg by mouth every 4 (four) hours as needed for nausea or vomiting.    [provider]  Skin Protectants, Misc. (EUCERIN) cream Apply 1 application topically See admin instructions. Apply topically to face and arms twice daily for dry skin    [provider]      Allergies    Pseudoephedrine and Sympathomimetics    Review of Systems   Review of Systems  Unable to perform ROS: Patient nonverbal  All other systems reviewed and are negative.  Physical Exam Updated Vital Signs BP 107/72 (BP Location: Right Arm)  Pulse (!) 58    Temp 98.5 F (36.9 C) (Oral)    Resp 16    SpO2 96%  Physical Exam Vitals and nursing note reviewed.  Constitutional:      Comments: Patient somewhat chronically ill appearing, laying comfortably in bed smiling in no acute distress with occasional tremors   HENT:     Head: Normocephalic and atraumatic.  Eyes:     Extraocular Movements: Extraocular movements intact.     Pupils: Pupils are equal, round, and  reactive to light.  Cardiovascular:     Rate and Rhythm: Normal rate and regular rhythm.     Heart sounds: Normal heart sounds.  Pulmonary:     Effort: Pulmonary effort is normal. No respiratory distress.     Breath sounds: Normal breath sounds. No stridor. No wheezing, rhonchi or rales.     Comments: Lungs clear to auscultation in all fields Abdominal:     General: Abdomen is flat. Bowel sounds are normal.     Palpations: Abdomen is soft.  Musculoskeletal:        General: Normal range of motion.     Right lower leg: No edema.     Left lower leg: No edema.  Skin:    General: Skin is warm and dry.  Neurological:     Mental Status: Mental status is at baseline.    ED Results / Procedures / Treatments   Labs (all labs ordered are listed, but only abnormal results are displayed) Labs Reviewed  CBC WITH DIFFERENTIAL/PLATELET - Abnormal; Notable for the following components:      Result Value   RBC 3.84 (*)    All other components within normal limits  COMPREHENSIVE METABOLIC PANEL - Abnormal; Notable for the following components:   Calcium 8.8 (*)    Anion gap 4 (*)    All other components within normal limits  CBG MONITORING, ED - Abnormal; Notable for the following components:   Glucose-Capillary 103 (*)    All other components within normal limits  RESP PANEL BY RT-PCR (FLU A&B, COVID) [id number]  URINALYSIS, ROUTINE W REFLEX MICROSCOPIC    EKG None  Radiology DG Chest 2 View  Result Date: 08/23/2021 CLINICAL DATA:  Altered mental status.  Tremors. EXAM: CHEST - 2 VIEW COMPARISON:  Chest x-ray dated August 22, 2020. FINDINGS: The heart size and mediastinal contours are within normal limits. Normal pulmonary vascularity. No focal consolidation, pleural effusion, or pneumothorax. No acute osseous abnormality. IMPRESSION: No active cardiopulmonary disease. Electronically Signed   By: Titus Dubin M.D.   On: 08/23/2021 17:02   DG Abdomen 1 View  Result Date:  08/23/2021 CLINICAL DATA:  Altered mental status. Evaluate for obstruction. EXAM: ABDOMEN - 1 VIEW COMPARISON:  CT abdomen and pelvis dated August 22, 2020. FINDINGS: The bowel gas pattern is normal. Moderate colonic stool burden. No radio-opaque calculi or other significant radiographic abnormality are seen. Severe scoliosis. No acute osseous abnormality. IMPRESSION: No acute findings. Electronically Signed   By: Titus Dubin M.D.   On: 08/23/2021 17:03   CT Head Wo Contrast  Result Date: 08/23/2021 CLINICAL DATA:  Altered mental status, tremors, cerebral palsy EXAM: CT HEAD WITHOUT CONTRAST TECHNIQUE: Contiguous axial images were obtained from the base of the skull through the vertex without intravenous contrast. COMPARISON:  08/22/2020 FINDINGS: Examination is generally somewhat limited by motion artifact. Brain: No evidence of acute infarction, hemorrhage, hydrocephalus, extra-axial collection or mass lesion/mass effect. Vascular: No hyperdense vessel or unexpected calcification. Skull: Normal. Negative  for fracture or focal lesion. Sinuses/Orbits: No acute finding. Other: None. IMPRESSION: Examination is generally somewhat limited by motion artifact. Within this limitation, no acute intracranial pathology. Electronically Signed   By: Delanna Ahmadi M.D.   On: 08/23/2021 16:19    Procedures Fecal disimpaction  Date/Time: 08/23/2021 9:40 PM Performed by: Bud Face, PA-C Authorized by: Bud Face, PA-C  Consent: Verbal consent obtained. Risks and benefits: risks, benefits and alternatives were discussed Consent given by: guardian Required items: required blood products, implants, devices, and special equipment available Patient identity confirmed: arm band Local anesthesia used: no  Anesthesia: Local anesthesia used: no  Sedation: Patient sedated: no  Patient tolerance: patient tolerated the procedure well with no immediate complications      Medications Ordered in  ED Medications - No data to display  ED Course/ Medical Decision Making/ A&P                           Medical Decision Making  Patient presents today from her group home for concerns regarding increased tremors. Patient with CP who is nonverbal and does not follow commands at baseline. Apparently according to caregiver, patient had an increase in tremors today requiring more Ativan than usual. She also had an episode where her eyes rolled back in her head and there was some concern for seizures. According to caregiver, nurse on staff did not feel that this met criteria to ER transport but she was overridden by caregiver who stated that she did require ER transport. Also some concern for dehydration and decreased stool production. Also concern for new cough. Will work-up for AMS in the setting of patients baseline condition.    Co morbidities that complicate the patient evaluation  Spastic cerebral palsy with tremors, developmental delay, patient is nonverbal   Additional history obtained:  Additional history obtained from group home caretakers   Lab Tests:  I Ordered, and personally interpreted labs.  The pertinent results include:  CBC without leukocytosis or anemia. CMP without acute findings. COVID and flu negative. UA with small leukocytes and many bacteria, urine culture pending.   Imaging Studies ordered:  I ordered imaging studies including CT head, DG chest, DG abd  I independently visualized and interpreted imaging which showed no acute intracranial abnormality, limited by motion artifact. No active cardiopulmonary disease. Moderate stool burden but no acute findings I agree with the radiologist interpretation    Medicines ordered and prescription drug management:  I ordered medication including fluids  for dehydration  Reevaluation of the patient after these medicines showed that the patient stayed the same I have reviewed the patients home medicines and have made  adjustments as needed   Test Considered:  Considered ordering a CT abdomen and pelvis with contrast in the setting of possible AMS with possible decreased oral production.  However, the patient's care supervisor was now present in the room upon reevaluation.  Supervisor states that she was not present today, however states that the patient who she is extremely familiar with is at baseline.  Additionally, she states that stool habits are strictly documented, and if the patient has had any decreased production on record she would have received an enema.  Patient states that she is not received anything for decreased stool production at this time.  In the presence of normal DG abdomen and patient is afebrile, nontoxic-appearing, and in no acute distress with no leukocytosis or anemia and is behaving at baseline, feel  that CT scan is not warranted at this time.    Dispostion:  After consideration of the diagnostic results and the patients response to treatment, I feel that the patent would benefit from outpatient management with close PCP follow-up.  Upon reevaluation of patient, care manager is now in the room who states that patient is acting completely at baseline with her tremors normal in quality and duration.  I have observed this patient for a significant amount of time and feel that she has been behaving similarly throughout her visit.  It does seem like there was some miscommunication at her group home about whether or not this patient warranted ER visit at all.  Patient is smiling and appears in no distress and is afebrile, nontoxic-appearing, reassuring vital signs.  Her work-up was completely unremarkable for acute abnormalities.  I have a low suspicion that she had a seizure this afternoon in the presence of normal work-up without leukocytosis, however she does already receive scheduled Ativan which has the option to be increased if necessary.  I feel that this management is sufficient for her at  this time.  Her urine did show some bacteria, however as prior stated she does not have a elevated white count or fever.  Culture is pending at discharge.  I do not feel that she needs antibiotic management at this time given clinical picture.  I did attempt fecal disimpaction as abdominal x-ray did indicate moderate stool burden.  I was able to remove a significant amount of soft stool from her rectal vault.  I do not feel there is any concerns for obstruction or other concerns related to this at this time.  Feel the patient is stable for discharge at this time.  Care manager in the room is in agreement, educated on red flag symptoms that would prompt immediate return.  Discharged in stable condition.   This is a shared visit with supervising physician Dr. Eulis Foster who has independently evaluated patient & provided guidance in evaluation/management/disposition, in agreement with care   Final Clinical Impression(s) / ED Diagnoses Final diagnoses:  Occasional tremors  Cerebral palsy, unspecified type ([place])    Rx / DC Orders ED Discharge Orders     None     An After Visit Summary was printed and given to the patient.     Bud Face, PA-C 08/23/21 2241    Bud Face, PA-C 08/23/21 2242    Daleen Bo, MD 08/23/21 [phone]

## 2021-08-23 NOTE — Discharge Instructions (Addendum)
Ms. Clothier work-up in the ER today was unremarkable for acute abnormalities.  Her urine sample did show a few bacteria, however there is no other evidence that she has a UTI.  I did order a culture which is pending and if it grows anything then pharmacy will call you with further recommendations in regards to this.  Otherwise, I recommend that she follow-up with her primary care in the next few days for continued evaluation.  Return if development of any new or worsening symptoms.

## 2021-08-23 NOTE — ED Notes (Addendum)
Attempted to in and out cath pt w/o success.  Due to contractures there was no way to safely introduce the catheter.  Multiple positions attempted.  Purewick placed on pt.  Maralyn Sago, PA notified.

## 2021-08-26 LAB — URINE CULTURE: Culture: >=100000 — AB

## 2021-08-27 ENCOUNTER — Telehealth: Payer: Self-pay

## 2021-08-27 NOTE — Telephone Encounter (Signed)
Post ED Visit - Positive Culture Follow-up  Culture report reviewed by antimicrobial stewardship pharmacist: [facility] []  , Pharm.D. []  Enzo Bi, Pharm.D., BCPS AQ-ID []  , Pharm.D., BCPS []  Celedonio Miyamoto, Pharm.D., BCPS []  [place], Garvin Fila.D., BCPS, AAHIVP []  , Pharm.D., BCPS, AAHIVP []  Georgina Pillion, PharmD, BCPS []  , PharmD, BCPS []  [place], PharmD, BCPS []  [address], PharmD []  , PharmD, BCPS []  Estella Husk, PharmD  Pharmacy Team [x]  Lysle Pearl, PharmD []  , PharmD []  Phillips Climes, PharmD []  , Rph []  Agapito Games) , PharmD []  Verlan Friends, PharmD []  , PharmD []  Mervyn Gay, PharmD []  , PharmD []  Vinnie Level, PharmD []  [facility], PharmD []  , PharmD []  Georgina Pillion, PharmD   Positive urine culture No treatment indicated, likely asymptomatic bacteria and no further patient follow-up is required at this time.  08/27/2021, 10:20 AM

## 2021-09-29 ENCOUNTER — Other Ambulatory Visit: Payer: Self-pay | Admitting: Family Medicine

## 2021-09-29 DIAGNOSIS — Z1231 Encounter for screening mammogram for malignant neoplasm of breast: Secondary | ICD-10-CM

## 2021-10-20 ENCOUNTER — Ambulatory Visit: Payer: Medicare Other

## 2022-02-15 IMAGING — CR DG CHEST 2V
2 series · 2 of 2 positions shown · non-contrast
Comparison: Chest x-ray dated August 22, 2020.

CLINICAL DATA: Altered mental status.  Tremors.

EXAM:
CHEST - 2 VIEW

[w chest lat]
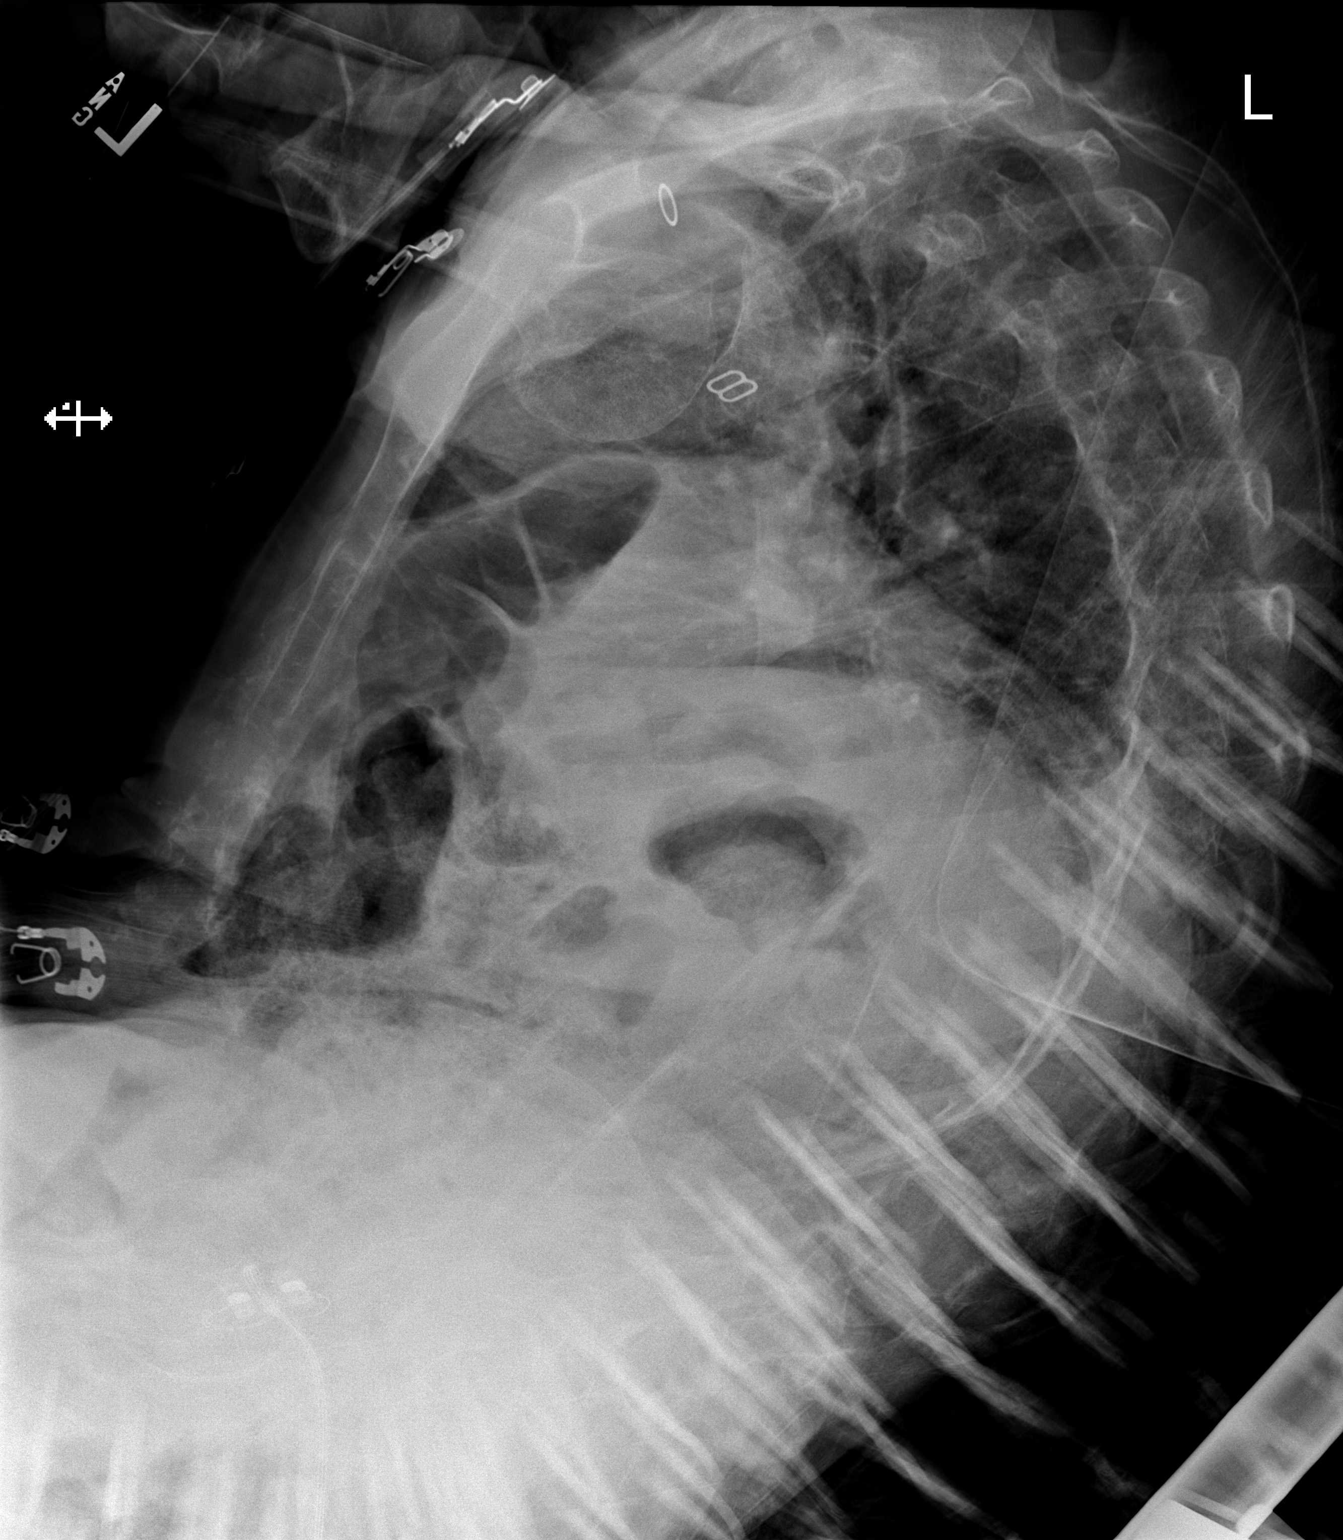

[x chest ap]
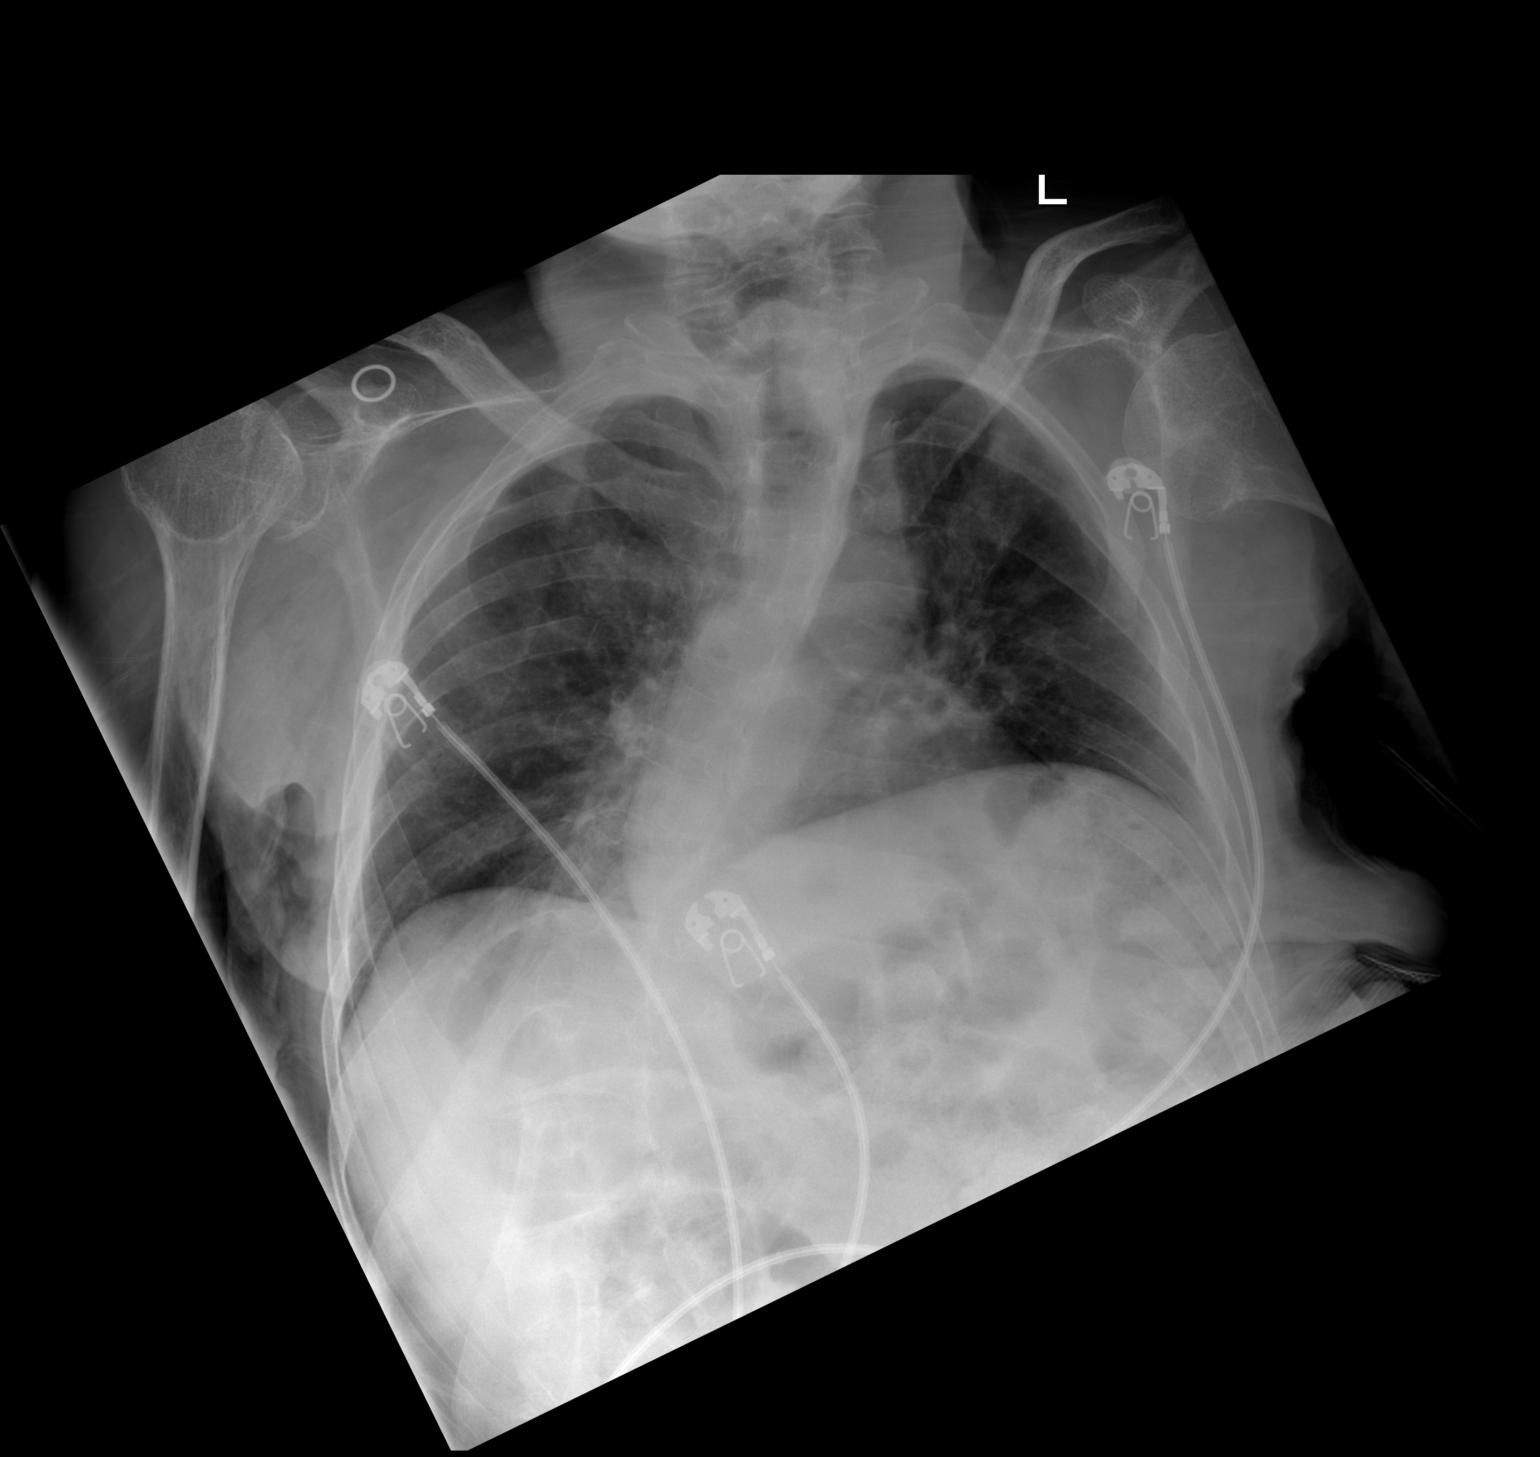

[2 of 2 positions shown; findings below may reference images not displayed]

FINDINGS: The heart size and mediastinal contours are within normal limits.
Normal pulmonary vascularity. No focal consolidation, pleural
effusion, or pneumothorax. No acute osseous abnormality.
IMPRESSION: No active cardiopulmonary disease.

## 2023-02-04 ENCOUNTER — Other Ambulatory Visit: Payer: Self-pay | Admitting: Family Medicine

## 2023-02-04 DIAGNOSIS — Z993 Dependence on wheelchair: Secondary | ICD-10-CM

## 2023-03-16 ENCOUNTER — Ambulatory Visit: Admission: RE | Admit: 2023-03-16 | Payer: 59 | Source: Ambulatory Visit

## 2023-03-16 ENCOUNTER — Ambulatory Visit
Admission: RE | Admit: 2023-03-16 | Discharge: 2023-03-16 | Disposition: A | Discharge status: Home / Self Care | Payer: 59 | Source: Ambulatory Visit | Attending: Family Medicine | Admitting: Family Medicine

## 2023-03-16 DIAGNOSIS — Z993 Dependence on wheelchair: Secondary | ICD-10-CM

## 2024-02-06 ENCOUNTER — Other Ambulatory Visit: Payer: Self-pay | Admitting: Family Medicine

## 2024-02-06 DIAGNOSIS — Z1231 Encounter for screening mammogram for malignant neoplasm of breast: Secondary | ICD-10-CM

## 2024-03-16 ENCOUNTER — Other Ambulatory Visit

## 2024-03-22 ENCOUNTER — Other Ambulatory Visit

## 2024-04-04 ENCOUNTER — Ambulatory Visit
Admission: RE | Admit: 2024-04-04 | Discharge: 2024-04-04 | Disposition: A | Discharge status: Home / Self Care | Source: Ambulatory Visit | Attending: Family Medicine | Admitting: Family Medicine

## 2024-04-04 DIAGNOSIS — Z1231 Encounter for screening mammogram for malignant neoplasm of breast: Secondary | ICD-10-CM

## 2024-07-22 ENCOUNTER — Emergency Department (HOSPITAL_COMMUNITY)
Admission: EM | Admit: 2024-07-22 | Discharge: 2024-07-23 | Disposition: A | Attending: Emergency Medicine | Admitting: Emergency Medicine

## 2024-07-22 ENCOUNTER — Other Ambulatory Visit: Payer: Self-pay

## 2024-07-22 ENCOUNTER — Encounter (HOSPITAL_COMMUNITY): Payer: Self-pay

## 2024-07-22 ENCOUNTER — Emergency Department (HOSPITAL_COMMUNITY)

## 2024-07-22 DIAGNOSIS — R251 Tremor, unspecified: Secondary | ICD-10-CM

## 2024-07-22 DIAGNOSIS — R739 Hyperglycemia, unspecified: Secondary | ICD-10-CM

## 2024-07-22 DIAGNOSIS — R509 Fever, unspecified: Secondary | ICD-10-CM

## 2024-07-22 DIAGNOSIS — D649 Anemia, unspecified: Secondary | ICD-10-CM

## 2024-07-22 LAB — CBC WITH DIFFERENTIAL/PLATELET
Abs Immature Granulocytes: 0.02 K/uL (ref 0.00–0.07)
Basophils Absolute: 0 K/uL (ref 0.0–0.1)
Basophils Relative: 0 %
Eosinophils Absolute: 0 K/uL (ref 0.0–0.5)
Eosinophils Relative: 0 %
HCT: 34.6 % — ABNORMAL LOW (ref 36.0–46.0)
Hemoglobin: 11.6 g/dL — ABNORMAL LOW (ref 12.0–15.0)
Immature Granulocytes: 0 %
Lymphocytes Relative: 18 %
Lymphs Abs: 1.6 K/uL (ref 0.7–4.0)
MCH: 31.7 pg (ref 26.0–34.0)
MCHC: 33.5 g/dL (ref 30.0–36.0)
MCV: 94.5 fL (ref 80.0–100.0)
Monocytes Absolute: 0.8 K/uL (ref 0.1–1.0)
Monocytes Relative: 9 %
Neutro Abs: 6.3 K/uL (ref 1.7–7.7)
Neutrophils Relative %: 73 %
Platelets: 203 K/uL (ref 150–400)
RBC: 3.66 MIL/uL — ABNORMAL LOW (ref 3.87–5.11)
RDW: 12.6 % (ref 11.5–15.5)
WBC: 8.6 K/uL (ref 4.0–10.5)
nRBC: 0 % (ref 0.0–0.2)

## 2024-07-22 LAB — COMPREHENSIVE METABOLIC PANEL WITH GFR
ALT: 23 U/L (ref 0–44)
AST: 35 U/L (ref 15–41)
Albumin: 4.3 g/dL (ref 3.5–5.0)
Alkaline Phosphatase: 121 U/L (ref 38–126)
Anion gap: 11 (ref 5–15)
BUN: 27 mg/dL — ABNORMAL HIGH (ref 8–23)
CO2: 25 mmol/L (ref 22–32)
Calcium: 9.1 mg/dL (ref 8.9–10.3)
Chloride: 104 mmol/L (ref 98–111)
Creatinine, Ser: 0.61 mg/dL (ref 0.44–1.00)
GFR, Estimated: >60 mL/min (ref >60)
Glucose, Bld: 121 mg/dL — ABNORMAL HIGH (ref 70–99)
Potassium: 3.8 mmol/L (ref 3.5–5.1)
Sodium: 141 mmol/L (ref 135–145)
Total Bilirubin: 0.9 mg/dL (ref 0.0–1.2)
Total Protein: 6.9 g/dL (ref 6.5–8.1)

## 2024-07-22 LAB — I-STAT CG4 LACTIC ACID, ED: Lactic Acid, Venous: 1.2 mmol/L (ref 0.5–1.9)

## 2024-07-22 LAB — PROTIME-INR
INR: 1.1 (ref 0.8–1.2)
Prothrombin Time: 15.1 s (ref 11.4–15.2)

## 2024-07-22 MED ORDER — ACETAMINOPHEN 650 MG RE SUPP
650.0000 mg | Freq: Once | RECTAL | Status: AC
  Administered 2024-07-22: 650 mg via RECTAL
  Filled 2024-07-22: qty 1

## 2024-07-22 MED ORDER — ACETAMINOPHEN 160 MG/5ML PO SOLN: 650.0000 mg | Freq: Once | ORAL | Status: DC

## 2024-07-22 MED ORDER — SODIUM CHLORIDE (PF) 0.9 % IJ SOLN
INTRAMUSCULAR | Status: AC
  Filled 2024-07-22: qty 50

## 2024-07-22 MED ORDER — IOHEXOL 300 MG/ML  SOLN
100.0000 mL | Freq: Once | INTRAMUSCULAR | Status: AC | PRN
  Administered 2024-07-22: 75 mL via INTRAVENOUS

## 2024-07-22 MED ORDER — LACTATED RINGERS IV BOLUS (SEPSIS)
1000.0000 mL | Freq: Once | INTRAVENOUS | Status: AC
  Administered 2024-07-22: 1000 mL via INTRAVENOUS

## 2024-07-22 NOTE — ED Provider Notes (Signed)
 Care assumed from Dr. Randol, patient with cerebral palsy and increased spasticity with concern for occult infection. She has a low grade fever. Urinalysis, viral PCR, and CT abdomen/pelvis is pending.  I have reviewed her respiratory panel, my interpretation is negative PCR for COVID-19 and influenza and RSV.  CT scan of abdomen and pelvis shows no acute process.  I have independently viewed the images, and agree with radiologist's interpretation.  We have not been able to get a urine sample.  She was not able to cooperate for an In-N-Out catheterization.  In spite of being in the emergency department for over 9 hours, we have not been able to obtain a specimen.  I am electing to empirically treat her for possible UTI.  I have ordered a dose of cephalexin  and I am discharging her with a prescription for cefdinir , instructions given to return if her condition seems to be worsening.  Results for orders placed or performed during the hospital encounter of 07/22/24  Comprehensive metabolic panel   Collection Time: 07/22/24  9:00 PM  Result Value Ref Range   Sodium 141 135 - 145 mmol/L   Potassium 3.8 3.5 - 5.1 mmol/L   Chloride 104 98 - 111 mmol/L   CO2 25 22 - 32 mmol/L   Glucose, Bld 121 (H) 70 - 99 mg/dL   BUN 27 (H) 8 - 23 mg/dL   Creatinine, Ser 9.38 0.44 - 1.00 mg/dL   Calcium 9.1 8.9 - 89.6 mg/dL   Total Protein 6.9 6.5 - 8.1 g/dL   Albumin 4.3 3.5 - 5.0 g/dL   AST 35 15 - 41 U/L   ALT 23 0 - 44 U/L   Alkaline Phosphatase 121 38 - 126 U/L   Total Bilirubin 0.9 0.0 - 1.2 mg/dL   GFR, Estimated >39 >39 mL/min   Anion gap 11 5 - 15  CBC with Differential   Collection Time: 07/22/24  9:00 PM  Result Value Ref Range   WBC 8.6 4.0 - 10.5 K/uL   RBC 3.66 (L) 3.87 - 5.11 MIL/uL   Hemoglobin 11.6 (L) 12.0 - 15.0 g/dL   HCT 65.3 (L) 63.9 - 53.9 %   MCV 94.5 80.0 - 100.0 fL   MCH 31.7 26.0 - 34.0 pg   MCHC 33.5 30.0 - 36.0 g/dL   RDW 87.3 88.4 - 84.4 %   Platelets 203 150 - 400 K/uL    nRBC 0.0 0.0 - 0.2 %   Neutrophils Relative % 73 %   Neutro Abs 6.3 1.7 - 7.7 K/uL   Lymphocytes Relative 18 %   Lymphs Abs 1.6 0.7 - 4.0 K/uL   Monocytes Relative 9 %   Monocytes Absolute 0.8 0.1 - 1.0 K/uL   Eosinophils Relative 0 %   Eosinophils Absolute 0.0 0.0 - 0.5 K/uL   Basophils Relative 0 %   Basophils Absolute 0.0 0.0 - 0.1 K/uL   Immature Granulocytes 0 %   Abs Immature Granulocytes 0.02 0.00 - 0.07 K/uL  Protime-INR   Collection Time: 07/22/24  9:00 PM  Result Value Ref Range   Prothrombin Time 15.1 11.4 - 15.2 seconds   INR 1.1 0.8 - 1.2  I-Stat Lactic Acid, ED   Collection Time: 07/22/24  9:12 PM  Result Value Ref Range   Lactic Acid, Venous 1.2 0.5 - 1.9 mmol/L  Resp panel by RT-PCR (RSV, Flu A&B, Covid) Anterior Nasal Swab   Collection Time: 07/22/24 11:30 PM   Specimen: Anterior Nasal Swab  Result Value  Ref Range   SARS Coronavirus 2 by RT PCR NEGATIVE NEGATIVE   Influenza A by PCR NEGATIVE NEGATIVE   Influenza B by PCR NEGATIVE NEGATIVE   Resp Syncytial Virus by PCR NEGATIVE NEGATIVE   CT ABDOMEN PELVIS W CONTRAST Result Date: 07/22/2024 EXAM: CT ABDOMEN AND PELVIS WITH CONTRAST 07/22/2024 11:08:44 PM TECHNIQUE: CT of the abdomen and pelvis was performed with the administration of 75 mL of iohexol  (OMNIPAQUE ) 300 MG/ML solution. Multiplanar reformatted images are provided for review. Automated exposure control, iterative reconstruction, and/or weight-based adjustment of the mA/kV was utilized to reduce the radiation dose to as low as reasonably achievable. COMPARISON: None available. CLINICAL HISTORY: Abdominal pain, acute, nonlocalized. FINDINGS: LOWER CHEST: No acute abnormality. LIVER: The liver is unremarkable. GALLBLADDER AND BILE DUCTS: Gallbladder is unremarkable. No biliary ductal dilatation. SPLEEN: No acute abnormality. PANCREAS: No acute abnormality. ADRENAL GLANDS: No acute abnormality. KIDNEYS, URETERS AND BLADDER: No stones in the kidneys or ureters.  No hydronephrosis. No perinephric or periureteral stranding. Urinary bladder is unremarkable. GI AND BOWEL: Stomach demonstrates no acute abnormality. No small or large bowel thickening or dilatation. The appendix is not definitely identified with no inflammatory changes in the right lower quadrant to suggest acute appendicitis. Colonic diverticulosis. There is no bowel obstruction. PERITONEUM AND RETROPERITONEUM: No ascites. No free air. VASCULATURE: Aorta is normal in caliber. LYMPH NODES: No lymphadenopathy. REPRODUCTIVE ORGANS: The uterus is unremarkable. Bilateral adnexa are unremarkable. No adnexal mass. BONES AND SOFT TISSUES: Lumbar dextroscoliosis. Right hip degenerative changes. No acute osseous abnormality. No focal soft tissue abnormality. IMPRESSION: 1. No acute findings in the abdomen or pelvis. 2. Colonic diverticulosis without evidence of diverticulitis. Electronically signed by: Morgane Naveau MD 07/22/2024 11:19 PM EST RP Workstation: [id number]   DG Chest Port 1 View Result Date: 07/22/2024 EXAM: 1 VIEW(S) XRAY OF THE CHEST 07/22/2024 09:02:00 PM COMPARISON: Chest x-ray 08/23/21 CLINICAL HISTORY: Questionable sepsis - evaluate for abnormality FINDINGS: LIMITATIONS/ARTIFACTS: Evaluation due to positioning. LUNGS AND PLEURA: Hypoventilatory changes throughout bilateral lungs. No pleural effusion. No pneumothorax. HEART AND MEDIASTINUM: Grossly unchanged cardiomediastinal silhouette. BONES AND SOFT TISSUES: Severe dextroscoliosis of thoracolumbar spine. IMPRESSION: 1. No acute cardiopulmonary abnormality. Limited evaluation due to positioning. Electronically signed by: Morgane Naveau MD 07/22/2024 09:12 PM EST RP Workstation: [id number]  ]\    Raford Lenis, MD 07/23/24 [phone]

## 2024-07-22 NOTE — ED Triage Notes (Signed)
 Pt BIB [facility] from [facility]) d/t tremors for 24 hours.  Pt was given Ativan  but did not help d/t pt having hx of Seizures.  Facility states she gets tremors associated with infection - possible UTI.    BP - 136/76 HR 94 RR 16 95 % RA Cbg 145 Temp 98.2

## 2024-07-22 NOTE — ED Provider Notes (Signed)
 [place] EMERGENCY DEPARTMENT AT [facility] Provider Note   CSN: [id number] Arrival date & time: 07/22/24  2025     Patient presents with: Tremors   [name] is a 68 y.o. female.   HPI   Patient has a history of developmental delay, encephalitis, spastic paresis, cerebral palsy, seizures.  Patient has been having trouble with increasing tremors and spasticity over the last 24 hours.  In the past this has been associated with an infections.  Patient has not had any known fevers.  No vomiting or diarrhea.  No rashes  Prior to Admission medications   Medication Sig Start Date End Date Taking? Authorizing Provider  chlorpheniramine (CHLOR-TRIMETON) 4 MG tablet Take 4 mg by mouth every 8 (eight) hours as needed for allergies.    [provider]  Eyelid Cleansers (OCUSOFT EYELID CLEANSING EX) Place 1 application into both eyes See admin instructions. Wash both eyelids every evening for eye hygiene.    [provider]  LORazepam  (ATIVAN ) 1 MG tablet Take 1 tablet (1 mg total) by mouth 3 (three) times daily. 08/23/16   Sheikh, Omair Latif, DO  miconazole (MICOTIN) 2 % powder Apply 1 application topically See admin instructions. Apply topically to buttocks and back daily after bath for skin care.    [provider]  neomycin-bacitracin-polymyxin (NEOSPORIN) [phone] ointment Apply 1 application topically daily as needed (for wound care).    [provider]  polyethylene glycol (MIRALAX  / GLYCOLAX ) 17 g packet Take 17 g by mouth daily as needed for up to 30 doses for moderate constipation. 08/22/20   Curatolo, Adam, DO  promethazine (PHENERGAN) 25 MG suppository Place 25 mg rectally every 4 (four) hours as needed for nausea or vomiting.    [provider]  promethazine (PHENERGAN) 25 MG tablet Take 25 mg by mouth every 4 (four) hours as needed for nausea or vomiting.    [provider]  Skin Protectants, Misc. (EUCERIN) cream  Apply 1 application topically See admin instructions. Apply topically to face and arms twice daily for dry skin    [provider]    Allergies: Pseudoephedrine and Sympathomimetics    Review of Systems  Updated Vital Signs BP 112/73   Pulse 85   Temp (!) 100.7 F (38.2 C) (Rectal)   Resp (!) 23   Ht 1.321 m (4' 4)   Wt 40.4 kg   SpO2 94%   BMI 23.16 kg/m   Physical Exam Vitals and nursing note reviewed.  Constitutional:      Appearance: She is well-developed. She is not diaphoretic.  HENT:     Head: Normocephalic and atraumatic.     Right Ear: External ear normal.     Left Ear: External ear normal.  Eyes:     General: No scleral icterus.       Right eye: No discharge.        Left eye: No discharge.     Conjunctiva/sclera: Conjunctivae normal.  Neck:     Trachea: No tracheal deviation.  Cardiovascular:     Rate and Rhythm: Normal rate and regular rhythm.  Pulmonary:     Effort: Pulmonary effort is normal. No respiratory distress.     Breath sounds: Normal breath sounds. No stridor. No wheezing or rales.  Abdominal:     General: Bowel sounds are normal. There is no distension.     Palpations: Abdomen is soft.     Tenderness: There is abdominal tenderness. There is no guarding  or rebound.     Comments: Difficult to assess although there does appear to be some tenderness to palpation on exam  Musculoskeletal:        General: No tenderness or deformity.     Cervical back: Neck supple.  Skin:    General: Skin is warm and dry.     Findings: No rash.  Neurological:     General: No focal deficit present.     Mental Status: She is alert.     Cranial Nerves: No cranial nerve deficit, dysarthria or facial asymmetry.     Sensory: No sensory deficit.     Motor: Tremor and abnormal muscle tone present. No seizure activity.     Comments: Increased spasticity, contractures in the extremities  Psychiatric:        Mood and Affect: Mood normal.     (all labs  ordered are listed, but only abnormal results are displayed) Labs Reviewed  COMPREHENSIVE METABOLIC PANEL WITH GFR - Abnormal; Notable for the following components:      Result Value   Glucose, Bld 121 (*)    BUN 27 (*)    All other components within normal limits  CBC WITH DIFFERENTIAL/PLATELET - Abnormal; Notable for the following components:   RBC 3.66 (*)    Hemoglobin 11.6 (*)    HCT 34.6 (*)    All other components within normal limits  CULTURE, BLOOD (ROUTINE X 2)  CULTURE, BLOOD (ROUTINE X 2)  PROTIME-INR  URINALYSIS, W/ REFLEX TO CULTURE (INFECTION SUSPECTED)  I-STAT CG4 LACTIC ACID, ED    EKG: EKG Interpretation Date/Time:  Sunday July 22 2024 20:42:44 EST Ventricular Rate:  97 PR Interval:  122 QRS Duration:  97 QT Interval:  349 QTC Calculation: 444 R Axis:   71  Text Interpretation: Sinus rhythm Borderline repolarization abnormality Confirmed by Randol Simmonds [phone]) on 07/22/2024 10:01:06 PM  Radiology: [facility] Chest Port 1 View Result Date: 07/22/2024 EXAM: 1 VIEW(S) XRAY OF THE CHEST 07/22/2024 09:02:00 PM COMPARISON: Chest x-ray 08/23/21 CLINICAL HISTORY: Questionable sepsis - evaluate for abnormality FINDINGS: LIMITATIONS/ARTIFACTS: Evaluation due to positioning. LUNGS AND PLEURA: Hypoventilatory changes throughout bilateral lungs. No pleural effusion. No pneumothorax. HEART AND MEDIASTINUM: Grossly unchanged cardiomediastinal silhouette. BONES AND SOFT TISSUES: Severe dextroscoliosis of thoracolumbar spine. IMPRESSION: 1. No acute cardiopulmonary abnormality. Limited evaluation due to positioning. Electronically signed by: Morgane Naveau MD 07/22/2024 09:12 PM EST RP Workstation: [id number]     Procedures   Medications Ordered in the ED  acetaminophen  (TYLENOL ) 160 MG/5ML solution 650 mg ( Oral Canceled Entry 07/22/24 2157)  iohexol  (OMNIPAQUE ) 300 MG/ML solution 100 mL (has no administration in time range)  lactated ringers  bolus 1,000 mL (1,000 mLs Intravenous  New Bag/Given 07/22/24 2108)  acetaminophen  (TYLENOL ) suppository 650 mg (650 mg Rectal Given 07/22/24 2157)    Clinical Course as of 07/22/24 2244  Sun Jul 22, 2024  2130 I-Stat Lactic Acid, ED Normal [JK]  2219 Chest x-ray without pneumonia.  CBC and metabolic panel unremarkable. [JK]  2220 Still waiting on urine sample.  Considering patient did appear to have some discomfort with palpation it is very difficult to get a history from her will proceed with CT scan of the abdomen pelvis [JK]    Clinical Course User Index [JK] Randol Simmonds, MD                                 Medical Decision Making Amount and/or  Complexity of Data Reviewed Labs: ordered. Decision-making details documented in ED Course. Radiology: ordered.  Risk OTC drugs. Prescription drug management.   Patient presents ED for evaluation of intermittent tremors.  Patient does have history of cerebral palsy.  At baseline she is nonverbal and contracted.  It is difficult to obtain any history as patient is unable to communicate.  Patient did have low-grade temperature here today.  No obvious signs of infection externally.  Labs are otherwise unremarkable.  No signs of seizure activity.  She did seem to have some abdominal tenderness on exam although that is difficult to say for certain.  For this reason and the low-grade temperature we will proceed with abdominal CT.  Will also check urinalysis.  Nursing staff attempted an hour unsuccessful.  Patient has PureWick catheter.  Care turned over to Dr Raford at shift change     Final diagnoses:  Fever, unspecified fever cause  Tremor    ED Discharge Orders     None          Randol Simmonds, MD 07/22/24 2244

## 2024-07-23 DIAGNOSIS — R509 Fever, unspecified: Secondary | ICD-10-CM | POA: Diagnosis not present

## 2024-07-23 LAB — RESP PANEL BY RT-PCR (RSV, FLU A&B, COVID)  RVPGX2
Influenza A by PCR: NEGATIVE
Influenza B by PCR: NEGATIVE
Resp Syncytial Virus by PCR: NEGATIVE
SARS Coronavirus 2 by RT PCR: NEGATIVE

## 2024-07-23 MED ORDER — CEPHALEXIN 500 MG PO CAPS
500.0000 mg | ORAL_CAPSULE | Freq: Once | ORAL | Status: AC
  Administered 2024-07-23: 500 mg via ORAL
  Filled 2024-07-23: qty 1

## 2024-07-23 MED ORDER — CEFDINIR 300 MG PO CAPS: 300.0000 mg | ORAL_CAPSULE | Freq: Two times a day (BID) | ORAL | 0 refills | Status: AC

## 2024-07-23 NOTE — Discharge Instructions (Signed)
 Testing did not show a clear cause for the fever today, but we were not able to get a urine sample for testing.  I am giving her a prescription for an antibiotic assuming that she has a urinary tract infection.  Please return to the emergency department if she seems to be getting worse.

## 2024-07-23 NOTE — ED Notes (Signed)
 Nurse assisted care giver to get patient into her chair for discharge. Patient d/c with care giver and care giver is aware of patient d.c instructions. Iv has been removed.

## 2024-07-28 LAB — CULTURE, BLOOD (ROUTINE X 2)
Culture: NO GROWTH
Culture: NO GROWTH
Special Requests: ADEQUATE

## 2024-09-12 ENCOUNTER — Emergency Department (HOSPITAL_COMMUNITY)

## 2024-09-12 ENCOUNTER — Emergency Department (HOSPITAL_COMMUNITY)
Admission: EM | Admit: 2024-09-12 | Discharge: 2024-09-13 | Disposition: A | Discharge status: Home / Self Care | Source: Skilled Nursing Facility | Attending: Emergency Medicine | Admitting: Emergency Medicine

## 2024-09-12 ENCOUNTER — Other Ambulatory Visit: Payer: Self-pay

## 2024-09-12 DIAGNOSIS — R251 Tremor, unspecified: Secondary | ICD-10-CM | POA: Insufficient documentation

## 2024-09-12 LAB — CBC WITH DIFFERENTIAL/PLATELET
Abs Immature Granulocytes: 0.02 10*3/uL (ref 0.00–0.07)
Basophils Absolute: 0 10*3/uL (ref 0.0–0.1)
Basophils Relative: 0 %
Eosinophils Absolute: 0.1 10*3/uL (ref 0.0–0.5)
Eosinophils Relative: 1 %
HCT: 34 % — ABNORMAL LOW (ref 36.0–46.0)
Hemoglobin: 11.5 g/dL — ABNORMAL LOW (ref 12.0–15.0)
Immature Granulocytes: 0 %
Lymphocytes Relative: 21 %
Lymphs Abs: 1.8 10*3/uL (ref 0.7–4.0)
MCH: 31.6 pg (ref 26.0–34.0)
MCHC: 33.8 g/dL (ref 30.0–36.0)
MCV: 93.4 fL (ref 80.0–100.0)
Monocytes Absolute: 0.6 10*3/uL (ref 0.1–1.0)
Monocytes Relative: 7 %
Neutro Abs: 5.9 10*3/uL (ref 1.7–7.7)
Neutrophils Relative %: 71 %
Platelets: 207 10*3/uL (ref 150–400)
RBC: 3.64 MIL/uL — ABNORMAL LOW (ref 3.87–5.11)
RDW: 12.5 % (ref 11.5–15.5)
WBC: 8.5 10*3/uL (ref 4.0–10.5)
nRBC: 0 % (ref 0.0–0.2)

## 2024-09-12 LAB — COMPREHENSIVE METABOLIC PANEL WITH GFR
ALT: 31 U/L (ref 0–44)
AST: 48 U/L — ABNORMAL HIGH (ref 15–41)
Albumin: 4.2 g/dL (ref 3.5–5.0)
Alkaline Phosphatase: 136 U/L — ABNORMAL HIGH (ref 38–126)
Anion gap: 10 (ref 5–15)
BUN: 8 mg/dL (ref 8–23)
CO2: 26 mmol/L (ref 22–32)
Calcium: 9.4 mg/dL (ref 8.9–10.3)
Chloride: 107 mmol/L (ref 98–111)
Creatinine, Ser: 0.56 mg/dL (ref 0.44–1.00)
GFR, Estimated: >60 mL/min
Glucose, Bld: 104 mg/dL — ABNORMAL HIGH (ref 70–99)
Potassium: 3.8 mmol/L (ref 3.5–5.1)
Sodium: 143 mmol/L (ref 135–145)
Total Bilirubin: 0.6 mg/dL (ref 0.0–1.2)
Total Protein: 7 g/dL (ref 6.5–8.1)

## 2024-09-12 LAB — RESP PANEL BY RT-PCR (RSV, FLU A&B, COVID)  RVPGX2
Influenza A by PCR: NEGATIVE
Influenza B by PCR: NEGATIVE
Resp Syncytial Virus by PCR: NEGATIVE
SARS Coronavirus 2 by RT PCR: NEGATIVE

## 2024-09-12 LAB — URINALYSIS, W/ REFLEX TO CULTURE (INFECTION SUSPECTED)
Bacteria, UA: NONE SEEN
Bilirubin Urine: NEGATIVE
Glucose, UA: NEGATIVE mg/dL
Hgb urine dipstick: NEGATIVE
Ketones, ur: NEGATIVE mg/dL
Nitrite: NEGATIVE
Protein, ur: NEGATIVE mg/dL
Specific Gravity, Urine: 1.02 (ref 1.005–1.030)
pH: 5 (ref 5.0–8.0)

## 2024-09-12 LAB — I-STAT CG4 LACTIC ACID, ED: Lactic Acid, Venous: 0.7 mmol/L (ref 0.5–1.9)

## 2024-09-12 MED ORDER — ACETAMINOPHEN 325 MG PO TABS
650.0000 mg | ORAL_TABLET | Freq: Once | ORAL | Status: AC
  Administered 2024-09-12: 650 mg via ORAL
  Filled 2024-09-12: qty 2

## 2024-09-12 MED ORDER — MAGNESIUM SULFATE IN D5W 1-5 GM/100ML-% IV SOLN
1.0000 g | Freq: Once | INTRAVENOUS | Status: AC
  Administered 2024-09-12: 1 g via INTRAVENOUS
  Filled 2024-09-12: qty 100

## 2024-09-12 NOTE — ED Notes (Signed)
 [facility] called

## 2024-09-12 NOTE — ED Triage Notes (Signed)
 Pt staff reports increased tremors over the last 2-3 days which has in the past indicated infection. Pt is otherwise at baseline. Nonverbal, quadriplegic, contracted.   102 palp Hr 94 Cbg 122 96% ra

## 2024-09-12 NOTE — ED Provider Notes (Signed)
 4:53 PM Patient signed out to me by previous ED physician. Pt is a 69 yo female with quadriplegia, cerebral palsy, and ID presenting for tremors-shaking.    Labs stable, no s/s sepsis Cxr demonstrates no acute process.  UA pending     Physical Exam  BP 101/84   Pulse (!) 105   Temp 98.7 F (37.1 C) (Axillary)   Resp 16   SpO2 94%   Physical Exam  Procedures  Procedures  ED Course / MDM   Clinical Course as of 09/12/24 1652  Wed Sep 12, 2024  1611 WBC: 8.5 No leukocytosis  [HN]  1611 Resp panel by RT-PCR (RSV, Flu A&B, Covid) Anterior Nasal Swab neg [HN]  1612 DG Chest Port 1 View 1. No acute findings. [HN]  1612 Lactic Acid, Venous: 0.7 Wnl [HN]    Clinical Course User Index [HN] Franklyn Sid SAILOR, MD   Medical Decision Making Amount and/or Complexity of Data Reviewed Labs: ordered. Decision-making details documented in ED Course. Radiology: ordered. Decision-making details documented in ED Course.  Risk OTC drugs. Prescription drug management.   Patient is stable on exam.  She does tremor on exam.  This is a full body movement.  She is fully awake and aware at the time.  It appears that the patient's tremor is worse when I speak to her.  She has contractures in the upper extremities and is a quadriplegic.  Otherwise on exam she is nontoxic-appearing.  She is afebrile with no signs or symptoms of sepsis.  No leukocytosis.  Workup so far has been overall stable.  Her urine demonstrates no urinary tract infection.  I sent a urine culture just in case.  Caretakers at the bedside states that she normally tremors with infection.  Otherwise have recommended some magnesium  and Tylenol  for possible muscle spasms or discomfort.  I recommend she follows closely with her primary care physician tomorrow for close follow-up visit since they know her well and return to ED if anything worsens.       Elnor Bernarda SQUIBB, DO 09/12/24 2130

## 2024-09-12 NOTE — Discharge Instructions (Addendum)
 Today.  Workup has been stable.  Your laboratory studies demonstrate no signs or symptoms of sepsis.  No leukocytosis or suggestion of infection.  Your urine was clean and without infection.  I did send a urine culture.  This will take 24 to 48 hours to result.  If for any reason this grows bacteria we will call you and initiate antibiotics.  You were given Tylenol  and magnesium  for possible muscle spasms.  Please call and follow-up with your primary care physician first thing tomorrow morning if symptoms do not begin to improve for follow-up appointment.

## 2024-09-12 NOTE — ED Provider Notes (Signed)
 " [place] EMERGENCY DEPARTMENT AT [facility] Provider Note   CSN: [id number] Arrival date & time: 09/12/24  1334     History {Add pertinent medical, surgical, social history, OB history to HPI:1} Chief Complaint  Patient presents with   Tremors    [name] is a 69 y.o. female with cerebral palsy and increased spasticity who presents with increased tremors. Pt staff reports increased tremors over the last 2-3 days which has in the past indicated infection. Pt is otherwise at baseline. Nonverbal, quadriplegic, contracted. History provided by facility staff member who accompanies patient to ED. She states that other than the tremors, patient seems to be normal. No fevers that staff had noticed at facility. No nausea/vomiting or diarrhea. She doesn't have any skin ulcerations that staff are aware of.    102 palp Hr 94 Cbg 122 96% ra.   Seen for low grade fever on 07/22/24 and could not obtain urine sample so was empirically treated for possible UTI. Given dose of cephalexin  and dc'd w/ cefdinir .    Past Medical History:  Diagnosis Date   Cerebral palsy (HCC)    Encephalitis    Mental retardation    Seizures (HCC)    Spastic paresis (HCC)        Home Medications Prior to Admission medications  Medication Sig Start Date End Date Taking? Authorizing Provider  acetaminophen  (TYLENOL ) 160 MG/5ML solution Take 640 mg by mouth every 4 (four) hours as needed.    [provider]  aluminum-magnesium  hydroxide 200-200 MG/5ML suspension Take 15 mLs by mouth every 2 (two) hours as needed for indigestion.    [provider]  cefdinir  (OMNICEF ) 300 MG capsule Take 1 capsule (300 mg total) by mouth 2 (two) times daily. 07/23/24   Raford Lenis, MD  chlorpheniramine (CHLOR-TRIMETON) 4 MG tablet Take 4 mg by mouth every 8 (eight) hours as needed for allergies.    [provider]  Cholecalciferol (VITAMIN D3) 50 MCG (2000 UT) capsule Take 2,000  Units by mouth daily.    [provider]  diphenhydrAMINE (BENADRYL) 12.5 MG/5ML elixir Take 25 mg by mouth 4 (four) times daily as needed.    [provider]  Eyelid Cleansers (OCUSOFT EYELID CLEANSING EX) Place 1 application into both eyes See admin instructions. Wash both eyelids every evening for eye hygiene.    [provider]  guaiFENesin (ROBITUSSIN) 100 MG/5ML liquid Take 15 mLs by mouth every 4 (four) hours as needed for cough or to loosen phlegm.    [provider]  loperamide (IMODIUM A-D) 2 MG tablet Take 2 mg by mouth 3 (three) times daily as needed for diarrhea or loose stools.    [provider]  LORazepam  (ATIVAN ) 1 MG tablet Take 1 tablet (1 mg total) by mouth 3 (three) times daily. 08/23/16   Sherrill Cable Latif, DO  magnesium  hydroxide (MILK OF MAGNESIA) 400 MG/5ML suspension Take 30 mLs by mouth daily as needed for mild constipation.    [provider]  Menthol-Zinc Oxide (CALMOSEPTINE) 0.44-20.6 % OINT Apply 1 application  topically 2 (two) times daily as needed.    [provider]  miconazole (MICOTIN) 2 % powder Apply 1 application topically See admin instructions. Apply topically to buttocks and back daily after bath for skin care.    [provider]  polyethylene glycol (MIRALAX  / GLYCOLAX ) 17 g packet Take 17 g by mouth daily as needed for up to 30 doses for moderate constipation. Patient taking  differently: Take 17 g by mouth daily. 08/22/20   Curatolo, Adam, DO  promethazine (PHENERGAN) 25 MG tablet Take 25 mg by mouth every 4 (four) hours as needed for nausea or vomiting.    [provider]  Vitamins A & D (VITAMIN A & D) ointment Apply 1 Application topically 3 (three) times daily as needed for dry skin.    [provider]      Allergies    Pseudoephedrine and Sympathomimetics    Review of Systems   Review of Systems A 10 point review of systems was performed and is negative unless  otherwise reported in HPI.  Physical Exam Updated Vital Signs BP (!) 120/91 (BP Location: Left Arm)   Pulse 95   Temp 97.7 F (36.5 C) (Axillary)   Resp 19   SpO2 94%  Physical Exam General: chronically ill-appearing female, lying in bed.  HEENT: PERRLA, Sclera anicteric, MMM, trachea midline.  Cardiology: RRR, no murmurs/rubs/gallops. BL radial and DP pulses equal bilaterally.  Resp: Normal respiratory rate and effort. CTAB, no wheezes, rhonchi, crackles.  Abd: Soft, non-tender, non-distended. No rebound tenderness or guarding.  GU: Deferred. MSK: No peripheral edema or signs of trauma. Extremities without deformity or TTP. No cyanosis or clubbing. Skin: warm, dry. No rashes or lesions. Back: No CVA tenderness Neuro: A&Ox4, CNs II-XII grossly intact. MAEs. Sensation grossly intact.  Psych: Normal mood and affect.   ED Results / Procedures / Treatments   Labs (all labs ordered are listed, but only abnormal results are displayed) Labs Reviewed   All other components within normal limits  COMPREHENSIVE METABOLIC PANEL WITH GFR - Abnormal; Notable for the following components:   Glucose, Bld 104 (*)    AST 48 (*)    Alkaline Phosphatase 136 (*)    All other components within normal limits  CBC WITH DIFFERENTIAL/PLATELET - Abnormal; Notable for the following components:   RBC 3.64 (*)    Hemoglobin 11.5 (*)    HCT 34.0 (*)    All other components within normal limits  URINALYSIS, W/ REFLEX TO CULTURE (INFECTION SUSPECTED) - Abnormal; Notable for the following components:   APPearance HAZY (*)    Leukocytes,Ua TRACE (*)    All other components within normal limits  RESP PANEL BY RT-PCR (RSV, FLU A&B, COVID)  RVPGX2  URINE CULTURE  I-STAT CG4 LACTIC ACID, ED    EKG None  Radiology CXR: 1. No acute findings.   Procedures Procedures  {Document cardiac monitor, telemetry assessment procedure when appropriate:1}  Medications Ordered in ED Medications  acetaminophen   (TYLENOL ) tablet 650 mg (650 mg Oral Given 09/12/24 2020)  magnesium  sulfate IVPB 1 g 100 mL (0 g Intravenous Stopped 09/12/24 2040)    ED Course/ Medical Decision Making/ A&P                          Medical Decision Making Amount and/or Complexity of Data Reviewed Labs: ordered. Decision-making details documented in ED Course. Radiology: ordered. Decision-making details documented in ED Course.  Risk OTC drugs. Prescription drug management.    This patient presents to the ED for concern of ***, this involves an extensive number of treatment options, and is a complaint that carries with it a high risk of complications and morbidity.  I considered the following differential and admission for this acute, potentially life threatening condition.   MDM:    W/ report of tremors, consider possible infection as the facility was concerned for  Clinical Course as of 09/14/24 2028  Wed Sep 12, 2024  1611 WBC: 8.5 No leukocytosis  [HN]  1611 Resp panel by RT-PCR (RSV, Flu A&B, Covid) Anterior Nasal Swab neg [HN]  1612 DG Chest Port 1 View 1. No acute findings. [HN]  1612 Lactic Acid, Venous: 0.7 Wnl [HN]    Clinical Course User Index [HN] Franklyn Sid SAILOR, MD    Labs: I Ordered, and personally interpreted labs.  The pertinent results include:  those listed above  Imaging Studies ordered: I ordered imaging studies including CXR I independently visualized and interpreted imaging. I agree with the radiologist interpretation  Additional history obtained from chart review, staff at bedside.    Cardiac Monitoring: The patient was maintained on a cardiac monitor.  I personally viewed and interpreted the cardiac monitored which showed an underlying rhythm of: NSR  Social Determinants of Health: Lives at facility  Disposition:  Patient is signed out to the oncoming ED physician Dr. DELENA Pereyra who is made aware of her history, presentation, exam, workup, and plan.    Co morbidities  that complicate the patient evaluation  Past Medical History:  Diagnosis Date   Cerebral palsy (HCC)    Encephalitis    Mental retardation    Seizures (HCC)    Spastic paresis (HCC)      Medicines Meds ordered this encounter  Medications   acetaminophen  (TYLENOL ) tablet 650 mg   magnesium  sulfate IVPB 1 g 100 mL    I have reviewed the patients home medicines and have made adjustments as needed  Problem List / ED Course: Problem List Items Addressed This Visit   None Visit Diagnoses       Tremor    -  Primary            {Document critical care time when appropriate:1} {Document review of labs and clinical decision tools ie heart score, Chads2Vasc2 etc:1}  {Document your independent review of radiology images, and any outside records:1} {Document your discussion with family members, caretakers, and with consultants:1} {Document social determinants of health affecting pt's care:1} {Document your decision making why or why not admission, treatments were needed:1}  This note was created using dictation software, which may contain spelling or grammatical errors.  "

## 2024-09-13 LAB — BLOOD CULTURE ID PANEL (REFLEXED) - BCID2

## 2024-09-13 LAB — URINE CULTURE: Culture: NO GROWTH

## 2024-09-13 NOTE — ED Notes (Signed)
 09/13/24 1911 Attempted to call pt unsuccessful attempts with no answering service. Left message with LG Kayla to return call regarding + blood cultures.

## 2024-09-14 ENCOUNTER — Emergency Department (HOSPITAL_COMMUNITY)
Admission: EM | Admit: 2024-09-14 | Discharge: 2024-09-14 | Disposition: A | Discharge status: Skilled Nursing Facility | Attending: Emergency Medicine | Admitting: Emergency Medicine

## 2024-09-14 ENCOUNTER — Encounter (HOSPITAL_COMMUNITY): Payer: Self-pay

## 2024-09-14 ENCOUNTER — Other Ambulatory Visit: Payer: Self-pay

## 2024-09-14 DIAGNOSIS — R799 Abnormal finding of blood chemistry, unspecified: Secondary | ICD-10-CM | POA: Diagnosis present

## 2024-09-14 DIAGNOSIS — Z0189 Encounter for other specified special examinations: Secondary | ICD-10-CM

## 2024-09-14 DIAGNOSIS — R7881 Bacteremia: Secondary | ICD-10-CM | POA: Insufficient documentation

## 2024-09-14 LAB — COMPREHENSIVE METABOLIC PANEL WITH GFR
ALT: 29 U/L (ref 0–44)
AST: 39 U/L (ref 15–41)
Albumin: 3.5 g/dL (ref 3.5–5.0)
Alkaline Phosphatase: 106 U/L (ref 38–126)
Anion gap: 7 (ref 5–15)
BUN: 19 mg/dL (ref 8–23)
CO2: 27 mmol/L (ref 22–32)
Calcium: 8.1 mg/dL — ABNORMAL LOW (ref 8.9–10.3)
Chloride: 114 mmol/L — ABNORMAL HIGH (ref 98–111)
Creatinine, Ser: 0.5 mg/dL (ref 0.44–1.00)
GFR, Estimated: >60 mL/min
Glucose, Bld: 95 mg/dL (ref 70–99)
Potassium: 3.3 mmol/L — ABNORMAL LOW (ref 3.5–5.1)
Sodium: 147 mmol/L — ABNORMAL HIGH (ref 135–145)
Total Bilirubin: 0.8 mg/dL (ref 0.0–1.2)
Total Protein: 5.8 g/dL — ABNORMAL LOW (ref 6.5–8.1)

## 2024-09-14 LAB — CBC WITH DIFFERENTIAL/PLATELET
Abs Immature Granulocytes: 0.01 10*3/uL (ref 0.00–0.07)
Basophils Absolute: 0 10*3/uL (ref 0.0–0.1)
Basophils Relative: 0 %
Eosinophils Absolute: 0.2 10*3/uL (ref 0.0–0.5)
Eosinophils Relative: 3 %
HCT: 30 % — ABNORMAL LOW (ref 36.0–46.0)
Hemoglobin: 10.1 g/dL — ABNORMAL LOW (ref 12.0–15.0)
Immature Granulocytes: 0 %
Lymphocytes Relative: 37 %
Lymphs Abs: 2.2 10*3/uL (ref 0.7–4.0)
MCH: 32.5 pg (ref 26.0–34.0)
MCHC: 33.7 g/dL (ref 30.0–36.0)
MCV: 96.5 fL (ref 80.0–100.0)
Monocytes Absolute: 0.4 10*3/uL (ref 0.1–1.0)
Monocytes Relative: 7 %
Neutro Abs: 3.1 10*3/uL (ref 1.7–7.7)
Neutrophils Relative %: 53 %
Platelets: 168 10*3/uL (ref 150–400)
RBC: 3.11 MIL/uL — ABNORMAL LOW (ref 3.87–5.11)
RDW: 12.8 % (ref 11.5–15.5)
WBC: 5.9 10*3/uL (ref 4.0–10.5)
nRBC: 0 % (ref 0.0–0.2)

## 2024-09-14 LAB — I-STAT CG4 LACTIC ACID, ED: Lactic Acid, Venous: 0.7 mmol/L (ref 0.5–1.9)

## 2024-09-14 LAB — CBG MONITORING, ED: Glucose-Capillary: 89 mg/dL (ref 70–99)

## 2024-09-14 MED ORDER — POTASSIUM CHLORIDE 10 MEQ/100ML IV SOLN
10.0000 meq | Freq: Once | INTRAVENOUS | Status: AC
  Administered 2024-09-14: 10 meq via INTRAVENOUS

## 2024-09-14 MED ORDER — SODIUM CHLORIDE 0.9 % IV BOLUS
1000.0000 mL | Freq: Once | INTRAVENOUS | Status: AC
  Administered 2024-09-14: 1000 mL via INTRAVENOUS

## 2024-09-14 NOTE — ED Notes (Signed)
 Pt from [facility]

## 2024-09-14 NOTE — ED Notes (Signed)
 09/14/24 1125 Writer spoke with [employer] (care giver) for [employer] She is aware pt needs to return to the ED due to positive blood cultures. Dr Trisha requested pt return for evaluation. They are in agreement to have her return. Also, message was left with Kayla if she calls back.

## 2024-09-14 NOTE — ED Provider Notes (Signed)
 " [place] EMERGENCY DEPARTMENT AT [facility] Provider Note   CSN: [id number] Arrival date & time: 09/14/24  1238     Patient presents with: abnormal labs   [name] is a 69 y.o. female.   Pt is a 69 yo female with pmhx significant for seizures, MR, encephalitis, CP and spastic paresis.  Pt was here on 1/28 for increased tremors.  She had a work up done which looked negative during that visit.  She had + blood cultures (staph), so she was called and told to return.  Pt is unable to give any history.  She is here with a caregiver who does not know much about her.  Pt has not been febrile.         Prior to Admission medications  Medication Sig Start Date End Date Taking? Authorizing Provider  acetaminophen  (TYLENOL ) 160 MG/5ML solution Take 640 mg by mouth every 4 (four) hours as needed.    [provider]  aluminum-magnesium  hydroxide 200-200 MG/5ML suspension Take 15 mLs by mouth every 2 (two) hours as needed for indigestion.    [provider]  cefdinir  (OMNICEF ) 300 MG capsule Take 1 capsule (300 mg total) by mouth 2 (two) times daily. 07/23/24   Raford Lenis, MD  chlorpheniramine (CHLOR-TRIMETON) 4 MG tablet Take 4 mg by mouth every 8 (eight) hours as needed for allergies.    [provider]  Cholecalciferol (VITAMIN D3) 50 MCG (2000 UT) capsule Take 2,000 Units by mouth daily.    [provider]  diphenhydrAMINE (BENADRYL) 12.5 MG/5ML elixir Take 25 mg by mouth 4 (four) times daily as needed.    [provider]  Eyelid Cleansers (OCUSOFT EYELID CLEANSING EX) Place 1 application into both eyes See admin instructions. Wash both eyelids every evening for eye hygiene.    [provider]  guaiFENesin (ROBITUSSIN) 100 MG/5ML liquid Take 15 mLs by mouth every 4 (four) hours as needed for cough or to loosen phlegm.    [provider]  loperamide (IMODIUM A-D) 2 MG tablet Take 2 mg by mouth 3 (three) times daily  as needed for diarrhea or loose stools.    [provider]  LORazepam  (ATIVAN ) 1 MG tablet Take 1 tablet (1 mg total) by mouth 3 (three) times daily. 08/23/16   Sherrill Cable Latif, DO  magnesium  hydroxide (MILK OF MAGNESIA) 400 MG/5ML suspension Take 30 mLs by mouth daily as needed for mild constipation.    [provider]  Menthol-Zinc Oxide (CALMOSEPTINE) 0.44-20.6 % OINT Apply 1 application  topically 2 (two) times daily as needed.    [provider]  miconazole (MICOTIN) 2 % powder Apply 1 application topically See admin instructions. Apply topically to buttocks and back daily after bath for skin care.    [provider]  polyethylene glycol (MIRALAX  / GLYCOLAX ) 17 g packet Take 17 g by mouth daily as needed for up to 30 doses for moderate constipation. Patient taking differently: Take 17 g by mouth daily. 08/22/20   Curatolo, Adam, DO  promethazine (PHENERGAN) 25 MG tablet Take 25 mg by mouth every 4 (four) hours as needed for nausea or vomiting.    [provider]  Vitamins A & D (VITAMIN A & D) ointment Apply 1 Application topically 3 (three) times daily as needed for dry skin.    [provider]    Allergies: Pseudoephedrine and Sympathomimetics    Review of Systems  Unable to perform ROS: Patient nonverbal  All other systems reviewed and are negative.   Updated Vital Signs BP 119/72   Pulse 62   Temp 98.3 F (36.8 C) (Oral)   Resp 15   SpO2 94%   Physical Exam Vitals and nursing note reviewed.  Constitutional:      Appearance: Normal appearance.  HENT:     Head: Normocephalic and atraumatic.     Right Ear: External ear normal.     Left Ear: External ear normal.     Nose: Nose normal.     Mouth/Throat:     Mouth: Mucous membranes are moist.     Pharynx: Oropharynx is clear.  Eyes:     Extraocular Movements: Extraocular movements intact.     Conjunctiva/sclera: Conjunctivae normal.     Pupils: Pupils are equal, round,  and reactive to light.  Cardiovascular:     Rate and Rhythm: Normal rate and regular rhythm.     Pulses: Normal pulses.     Heart sounds: Normal heart sounds.  Pulmonary:     Effort: Pulmonary effort is normal.     Breath sounds: Normal breath sounds.  Abdominal:     General: Abdomen is flat. Bowel sounds are normal.     Palpations: Abdomen is soft.  Musculoskeletal:     Cervical back: Normal range of motion and neck supple.     Comments: Severe contractures  Skin:    General: Skin is warm.     Capillary Refill: Capillary refill takes less than 2 seconds.  Neurological:     Mental Status: She is alert. Mental status is at baseline.     (all labs ordered are listed, but only abnormal results are displayed) Labs Reviewed  CULTURE, BLOOD (ROUTINE X 2)  CULTURE, BLOOD (ROUTINE X 2)  CBC WITH DIFFERENTIAL/PLATELET  COMPREHENSIVE METABOLIC PANEL WITH GFR  CBG MONITORING, ED  I-STAT CG4 LACTIC ACID, ED  I-STAT CG4 LACTIC ACID, ED    EKG: None  Radiology: No results found.    Procedures   Medications Ordered in the ED  sodium chloride  0.9 % bolus 1,000 mL (has no administration in time range)                                    Medical Decision Making Amount and/or Complexity of Data Reviewed Labs: ordered.   This patient presents to the ED for concern of + blood culture, this involves an extensive number of treatment options, and is a complaint that carries with it a high risk of complications and morbidity.  The differential diagnosis includes contaminant, sepsis, bacteremia   Co morbidities that complicate the patient evaluation  eizures, MR, encephalitis, CP and spastic paresis   Additional history obtained:  Additional history obtained from epic chart review External records from outside source obtained and reviewed including EMS report/caregiver   Lab Tests:  I Ordered, and personally interpreted labs.  Labs are pending at shift change.  Medicines  ordered and prescription drug management:   I have reviewed the patients home medicines and have made adjustments as needed  Problem List / ED Course:  + blood cx from 1/28.  It is Staph and is likely a contaminant.  She is afebrile (rectal temp) and has no other signs of sepsis.  I have repeat blood cx, but I think she can go back to the facility.   Reevaluation:  After the interventions noted above, I reevaluated the patient  and found that they have :improved   Social Determinants of Health:  Lives in a facility   Dispostion:  After consideration of the diagnostic results and the patients response to treatment, I feel that the patent would benefit from discharge with outpatient f/u.       Final diagnoses:  Contamination of blood culture    ED Discharge Orders     None          Dean Clarity, MD 09/14/24 1649  "

## 2024-09-14 NOTE — ED Notes (Signed)
 Pt was asked to return due to positive cultures 2 out of 2. STAPHYLOCOCCUS HOMINIS  STAPHYLOCOCCUS EPIDERMIDIS

## 2024-09-14 NOTE — ED Provider Notes (Signed)
 Care assumed from Dr. Dean.  At time of transfer of care, patient is awaiting lab results to rule out significant abnormalities as she was sent back to the emergency department for positive blood culture.  Patient reportedly looks well and has no complaints and if labs do not show something significantly abnormal, we will plan for discharge home as per previous team plan.  6:27 PM Patient's labs have returned.  Patient is not have a lactic acidosis or leukocytosis.  Mild anemia compared to prior and she also look somewhat dehydrated with elevated sodium and chloride and slightly decreased potassium.  No critical abnormality seen and had discussion with patient and caregiver and we agree with plan for discharge home.  Blood cultures were sent so if something is positive they will be called.  Will discharge home to facility for outpatient follow-up as per previous team's plan.  Clinical Impression: 1. Contamination of blood culture   2. Encounter for laboratory test     Disposition: Discharge  Condition: Good  I have discussed the results, Dx and Tx plan with the pt(& family if present). He/she/they expressed understanding and agree(s) with the plan. Discharge instructions discussed at great length. Strict return precautions discussed and pt &/or family have verbalized understanding of the instructions. No further questions at time of discharge.    New Prescriptions   No medications on file    Follow Up: Hilma Philis HERO [address] [place] [address] [phone]  Schedule an appointment as soon as possible for a visit  As needed       Luqman Perrelli, Lonni PARAS, MD 09/14/24 [phone]

## 2024-09-14 NOTE — ED Triage Notes (Signed)
 Pt seen recently for sz, pt had a positive blood cx. Pt is contracted and postured with her hands at baseline. Nonverbal, quadriplegic, contracted.

## 2024-09-14 NOTE — Discharge Instructions (Addendum)
 The blood culture taken on 1/28 did grow out bacteria, but it is likely a contamination from normal bacteria that live on the skin.

## 2024-09-15 LAB — CULTURE, BLOOD (ROUTINE X 2): Special Requests: ADEQUATE

## 2024-09-16 ENCOUNTER — Telehealth (HOSPITAL_BASED_OUTPATIENT_CLINIC_OR_DEPARTMENT_OTHER): Payer: Self-pay | Admitting: *Deleted

## 2024-09-16 NOTE — Telephone Encounter (Signed)
 Post ED Visit - Positive Culture Follow-up  Culture report reviewed by antimicrobial stewardship pharmacist: [facility] []  Rankin Dee, Pharm.D. []  Venetia Gully, [address].D., BCPS AQ-ID []  Garrel Crews, Pharm.D., BCPS []  Almarie Lunger, Pharm.D., BCPS []  [place], [place].D., BCPS, AAHIVP []  Rosaline Bihari, Pharm.D., BCPS, AAHIVP []  Vernell Meier, PharmD, BCPS []  Latanya Hint, PharmD, BCPS []  Donald Medley, PharmD, BCPS []  Rocky Bold, PharmD []  Dorothyann Alert, PharmD, BCPS []  Morene Babe, PharmD  [facility] []  Rosaline Edison, PharmD []  Romona Bliss, PharmD []  Dolphus Roller, PharmD []  Veva Seip, Rph []  Vernell Daunt) Leonce, PharmD []  Eva Allis, PharmD []  Rosaline Millet, PharmD []  Iantha Batch, PharmD []  Arvin Gauss, PharmD []  Wanda Hasting, PharmD []  Ronal Rav, PharmD []  Rocky Slade, PharmD [x]  Camelia Marina,  PharmD   Positive blood culture Called back to ED fo + cultures. See notes 09/14/24. Pt discharged home. Contaminant.   Albino Alan Novak 09/16/2024, 8:34 AM

## 2024-09-19 LAB — CULTURE, BLOOD (ROUTINE X 2)
Culture: NO GROWTH
Special Requests: ADEQUATE
